# Patient Record
Sex: Female | Born: 1995 | Marital: Single | Smoking: Former smoker
Health system: Southern US, Community
[De-identification: ages and names within clinical notes are randomized; demographics above are authoritative.]

## PROBLEM LIST (undated history)

## (undated) DIAGNOSIS — J45909 Unspecified asthma, uncomplicated: Secondary | ICD-10-CM

## (undated) DIAGNOSIS — D259 Leiomyoma of uterus, unspecified: Secondary | ICD-10-CM

## (undated) DIAGNOSIS — T7840XA Allergy, unspecified, initial encounter: Secondary | ICD-10-CM

## (undated) DIAGNOSIS — N760 Acute vaginitis: Secondary | ICD-10-CM

## (undated) DIAGNOSIS — B379 Candidiasis, unspecified: Secondary | ICD-10-CM

## (undated) DIAGNOSIS — B9689 Other specified bacterial agents as the cause of diseases classified elsewhere: Secondary | ICD-10-CM

## (undated) HISTORY — DX: Unspecified asthma, uncomplicated: J45.909

## (undated) HISTORY — DX: Leiomyoma of uterus, unspecified: D25.9

## (undated) HISTORY — PX: NO PAST SURGERIES: SHX2092

## (undated) HISTORY — DX: Allergy, unspecified, initial encounter: T78.40XA

---
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
## 2013-07-25 ENCOUNTER — Emergency Department (INDEPENDENT_AMBULATORY_CARE_PROVIDER_SITE_OTHER)
Admission: EM | Admit: 2013-07-25 | Discharge: 2013-07-25 | Disposition: A | Discharge status: Home / Self Care | Payer: 59 | Source: Home / Self Care | Attending: Family Medicine | Admitting: Family Medicine

## 2013-07-25 ENCOUNTER — Other Ambulatory Visit (HOSPITAL_COMMUNITY)
Admission: RE | Admit: 2013-07-25 | Discharge: 2013-07-25 | Disposition: A | Discharge status: Home / Self Care | Payer: No Typology Code available for payment source | Source: Ambulatory Visit | Attending: Family Medicine | Admitting: Family Medicine

## 2013-07-25 ENCOUNTER — Encounter (HOSPITAL_COMMUNITY): Payer: Self-pay | Admitting: Emergency Medicine

## 2013-07-25 DIAGNOSIS — N760 Acute vaginitis: Secondary | ICD-10-CM | POA: Diagnosis present

## 2013-07-25 DIAGNOSIS — Z113 Encounter for screening for infections with a predominantly sexual mode of transmission: Secondary | ICD-10-CM | POA: Insufficient documentation

## 2013-07-25 DIAGNOSIS — B373 Candidiasis of vulva and vagina: Secondary | ICD-10-CM

## 2013-07-25 DIAGNOSIS — B3731 Acute candidiasis of vulva and vagina: Secondary | ICD-10-CM

## 2013-07-25 LAB — POCT URINALYSIS DIP (DEVICE)
BILIRUBIN URINE: NEGATIVE
GLUCOSE, UA: NEGATIVE mg/dL
HGB URINE DIPSTICK: NEGATIVE
KETONES UR: NEGATIVE mg/dL
NITRITE: NEGATIVE
PH: 6 (ref 5.0–8.0)
Protein, ur: >=300 mg/dL — AB
Specific Gravity, Urine: >=1.03 (ref 1.005–1.030)
Urobilinogen, UA: 0.2 mg/dL (ref 0.0–1.0)

## 2013-07-25 LAB — POCT PREGNANCY, URINE: Preg Test, Ur: NEGATIVE

## 2013-07-25 LAB — HIV ANTIBODY (ROUTINE TESTING W REFLEX): HIV: NONREACTIVE

## 2013-07-25 MED ORDER — TERCONAZOLE 80 MG VA SUPP: 80.0000 mg | Freq: Every day | VAGINAL | Status: DC

## 2013-07-25 MED ORDER — FLUCONAZOLE 150 MG PO TABS: 150.0000 mg | ORAL_TABLET | Freq: Once | ORAL | Status: DC

## 2013-07-25 NOTE — Discharge Instructions (Signed)
Use medicine as prescribed, we will call if tests show a need for other treatment.  

## 2013-07-25 NOTE — ED Provider Notes (Signed)
CSN: [id number]     Arrival date & time 07/25/13  1223 History   First MD Initiated Contact with Patient 07/25/13 1433     Chief Complaint  Patient presents with  . Vaginal Pain   (Consider location/radiation/quality/duration/timing/severity/associated sxs/prior Treatment) Patient is a 18 y.o. female presenting with vaginal discharge. The history is provided by the patient.  Vaginal Discharge Quality:  White Severity:  Mild Onset quality:  Gradual Duration:  1 day Chronicity:  New Context: recent antibiotic use   Relieved by:  None tried Worsened by:  Nothing tried Ineffective treatments:  None tried Associated symptoms: no abdominal pain, no dysuria, no nausea, no rash, no urinary frequency and no vaginal itching   Risk factors: unprotected sex     Past Medical History  Diagnosis Date  . Asthma   . Bacterial vaginosis   . Yeast infection    Past Surgical History  Procedure Laterality Date  . No past surgeries     Family History  Problem Relation Age of Onset  . Arthritis Mother   . Hypertension Father   . Arthritis Maternal Grandmother    History  Substance Use Topics  . Smoking status: Current Some Day Smoker -- 0.25 packs/day  . Smokeless tobacco: Not on file  . Alcohol Use: No   OB History   Grav Para Term Preterm Abortions TAB SAB Ect Mult Living   0              Review of Systems  Constitutional: Negative.   Gastrointestinal: Negative.  Negative for nausea and abdominal pain.  Genitourinary: Positive for vaginal discharge. Negative for dysuria, urgency, vaginal bleeding, menstrual problem and pelvic pain.    Allergies  Review of patient's allergies indicates no known allergies.  Home Medications   Prior to Admission medications   Medication Sig Start Date End Date Taking? Authorizing Provider  albuterol (PROVENTIL HFA;VENTOLIN HFA) 108 (90 BASE) MCG/ACT inhaler Inhale 2 puffs into the lungs every 6 (six) hours as needed for wheezing or shortness of  breath (asthma).    Historical Provider, MD  clotrimazole (GYNE-LOTRIMIN) 1 % vaginal cream Place 1 Applicatorful vaginally at bedtime. 06/10/12   Freddi StarrJulie N Ethier, PA-C  fluconazole (DIFLUCAN) 150 MG tablet Take 1 tablet (150 mg total) by mouth once. 06/10/12   Freddi StarrJulie N Ethier, PA-C  nystatin cream (MYCOSTATIN) Apply to affected area 2 times daily 06/10/12   Freddi StarrJulie N Ethier, PA-C  triamcinolone cream (KENALOG) 0.1 % Apply topically 2 (two) times daily. 06/10/12   Freddi StarrJulie N Ethier, PA-C   BP 126/74  Pulse 61  Temp(Src) 98.8 F (37.1 C) (Oral)  Resp 16  SpO2 100% Physical Exam  Nursing note and vitals reviewed. Constitutional: She appears well-developed and well-nourished.  Abdominal: Soft. Bowel sounds are normal.  Genitourinary: Uterus normal. Cervix exhibits discharge. Cervix exhibits no motion tenderness. Right adnexum displays no tenderness. Left adnexum displays no tenderness. There is erythema around the vagina. No tenderness or bleeding around the vagina. No foreign body around the vagina. Vaginal discharge found.    ED Course  Procedures (including critical care time) Labs Review Labs Reviewed  POCT URINALYSIS DIP (DEVICE) - Abnormal; Notable for the following:    Protein, ur >=300 (*)    Leukocytes, UA TRACE (*)    All other components within normal limits  HIV ANTIBODY (ROUTINE TESTING)  POCT PREGNANCY, URINE  CERVICOVAGINAL ANCILLARY ONLY    Imaging Review No results found.   MDM   1. Candida vaginitis  Linna HoffJames D Kindl, MD 07/25/13 1520

## 2013-07-25 NOTE — ED Notes (Signed)
C/o vaginal swelling that started yesterday

## 2013-07-26 LAB — CERVICOVAGINAL ANCILLARY ONLY
CHLAMYDIA, DNA PROBE: NEGATIVE
NEISSERIA GONORRHEA: NEGATIVE
WET PREP (BD AFFIRM): NEGATIVE
Wet Prep (BD Affirm): POSITIVE — AB
Wet Prep (BD Affirm): POSITIVE — AB

## 2013-07-26 NOTE — ED Notes (Signed)
GC/Chlamydia neg., Affirm: Candida and Gardnerella pos., Trich neg.  Pt. adequately treated with Diflucan and Terazol supp.  Pt. had Flagyl called in today. 07/26/2013 Vassie MoselleSuzanne M Keamber Macfadden

## 2013-07-26 NOTE — ED Notes (Signed)
Patient called , c/o she is continuing to have vaginal discomfort. After verifying ID, discussed lab findings, and after discussion w Dr Artis FlockKindl , have called Rx for Flagyl 500 mg , 1 PO , BID x 7 days, NR, [facility]. Spoke directly w pharmacist

## 2013-11-08 ENCOUNTER — Ambulatory Visit (INDEPENDENT_AMBULATORY_CARE_PROVIDER_SITE_OTHER): Payer: No Typology Code available for payment source | Admitting: Family Medicine

## 2013-11-08 VITALS — BP 119/82 | HR 74 | Temp 98.4°F | Resp 16 | Ht 67.0 in | Wt 197.0 lb

## 2013-11-08 DIAGNOSIS — L819 Disorder of pigmentation, unspecified: Secondary | ICD-10-CM

## 2013-11-08 DIAGNOSIS — L309 Dermatitis, unspecified: Secondary | ICD-10-CM

## 2013-11-08 DIAGNOSIS — K130 Diseases of lips: Secondary | ICD-10-CM

## 2013-11-08 DIAGNOSIS — L259 Unspecified contact dermatitis, unspecified cause: Secondary | ICD-10-CM

## 2013-11-08 MED ORDER — TRIAMCINOLONE ACETONIDE 0.1 % EX CREA: 1.0000 "application " | TOPICAL_CREAM | Freq: Two times a day (BID) | CUTANEOUS | Status: DC

## 2013-11-08 NOTE — Patient Instructions (Signed)
Use the triamcinolone cream twice daily on arms and neck, once daily on face for 3 days.  Afterwards can use some over-the-counter 1% hydrocortisone cream on the face and lips for about 5 more days.  If not much better in the next 2-3 weeks please return  If it does improve considerably, return only if needed. The medicines can be used intermittently, but I would not use them long-term continuously

## 2013-11-08 NOTE — Progress Notes (Signed)
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, rising junior. She has a history of having a lot of eczema when she was younger, probably last was treated for it she is in high school. She has developed a rash on her face neck and arms. It deviates, though it's not itching as much now. She had crusting and flaking scaling of her lips associated with this, as well as hyperpigmentation developing in splotches. None elsewhere her body.  Objective: No acute distress. Has a little crusty dryness of her lips, especially the left upper and across the lower. The inner aspect of the lips has some hyperpigmented spots. The rest of the oral cavity does not seem to have those. She has a fine eczematoid bumpy rash on her circumoral area and chin. The neck has anteriorly a splotchy hyperpigmentation without much flaking of the skin. The antecubital fossa on the insides have some of the similar rash. The back of the neck looks fairly well spared though there are some little areas of pigment.  Assessment: Eczema Dry lips Hyperpigmentation  Plan: Will treat with triamcinolone cream and hydrocortisone cream. If problems continue to persist she is to return. Can use the topical medications on an intermittent basis if needed

## 2016-01-19 ENCOUNTER — Emergency Department (HOSPITAL_COMMUNITY)
Admission: EM | Admit: 2016-01-19 | Discharge: 2016-01-19 | Disposition: A | Discharge status: Home / Self Care | Payer: PRIVATE HEALTH INSURANCE | Attending: Emergency Medicine | Admitting: Emergency Medicine

## 2016-01-19 ENCOUNTER — Emergency Department (HOSPITAL_COMMUNITY): Payer: PRIVATE HEALTH INSURANCE

## 2016-01-19 ENCOUNTER — Encounter (HOSPITAL_COMMUNITY): Payer: Self-pay | Admitting: *Deleted

## 2016-01-19 DIAGNOSIS — J45909 Unspecified asthma, uncomplicated: Secondary | ICD-10-CM | POA: Diagnosis not present

## 2016-01-19 DIAGNOSIS — Z87891 Personal history of nicotine dependence: Secondary | ICD-10-CM | POA: Diagnosis not present

## 2016-01-19 DIAGNOSIS — G518 Other disorders of facial nerve: Secondary | ICD-10-CM

## 2016-01-19 DIAGNOSIS — G500 Trigeminal neuralgia: Secondary | ICD-10-CM | POA: Insufficient documentation

## 2016-01-19 DIAGNOSIS — R51 Headache: Secondary | ICD-10-CM | POA: Diagnosis present

## 2016-01-19 MED ORDER — IBUPROFEN 600 MG PO TABS: 600.0000 mg | ORAL_TABLET | Freq: Four times a day (QID) | ORAL | 0 refills | Status: AC | PRN

## 2016-01-19 NOTE — ED Notes (Signed)
Pt c/o head pressure and just feeling weird in the head for one week.  Alert oriented skin warm and dry  No distress

## 2016-01-19 NOTE — ED Notes (Signed)
Declined W/C at D/C and was escorted to lobby by RN. 

## 2016-01-19 NOTE — ED Provider Notes (Signed)
[facility]-EMER[facility]ENCY DEPT Provider Note   CSN: [id number] Arrival date & time: 01/19/16  0151     History   Chief Complaint Chief Complaint  Patient presents with  . Head Pressure    HPI Catherine Willis is a 20 y.o. female.  HPI  Patient with no PMH of significance comes ot the ER for evaluation of 2 week, worse in the past 3 days symptoms of "not feeling right", head pressure and sensations of numbness to the left anterior scalp and posterior scalp. She says that she was going to go to an UC in the morning but the sensations were so bothersome that she felt like she may not live through the night. She has not had severe pain but more discomfort. She has not had any fevers, neck pain, weakness, change in vision, difficulty walking, change in coordination, weight loss, CP, back pain, SOB, or any other associated symptoms. She denies having a hx of migraines or history of similar symptoms.  Past Medical History:  Diagnosis Date  . Allergy   . Asthma   . Bacterial vaginosis   . Yeast infection     There are no active problems to display for this patient.   Past Surgical History:  Procedure Laterality Date  . NO PAST SURGERIES      OB History    Gravida Para Term Preterm AB Living   0             SAB TAB Ectopic Multiple Live Births                   Home Medications    Prior to Admission medications   Medication Sig Start Date End Date Taking? Authorizing Provider  albuterol (PROVENTIL HFA;VENTOLIN HFA) 108 (90 BASE) MCG/ACT inhaler Inhale 2 puffs into the lungs every 6 (six) hours as needed for wheezing or shortness of breath (asthma).   Yes Historical Provider, MD    Family History Family History  Problem Relation Age of Onset  . Arthritis Mother   . Hypertension Father   . Arthritis Maternal Grandmother   . Cancer Maternal Grandmother   . Cancer Paternal Grandfather     Social History Social History  Substance Use Topics  . Smoking status: Former Smoker      Packs/day: 0.25  . Smokeless tobacco: Never Used  . Alcohol use Yes     Allergies   Review of patient's allergies indicates no known allergies.   Review of Systems Review of Systems Review of Systems All other systems negative except as documented in the HPI. All pertinent positives and negatives as reviewed in the HPI.   Physical Exam Updated Vital Signs BP 124/79 (BP Location: Right Arm)   Pulse 79   Temp 98.3 F (36.8 C) (Oral)   Resp 18   Ht 5\' 8"  (1.727 m)   Wt 88.5 kg   LMP 01/12/2016   SpO2 100%   BMI 29.65 kg/m   Physical Exam  Constitutional: She appears well-developed and well-nourished.  HENT:  Head: Normocephalic and atraumatic.  Eyes: Conjunctivae are normal. Pupils are equal, round, and reactive to light.  Neck: Trachea normal, normal range of motion and full passive range of motion without pain. Neck supple.  Cardiovascular: Normal rate, regular rhythm and normal pulses.   Pulmonary/Chest: Effort normal and breath sounds normal. Chest wall is not dull to percussion. She exhibits no tenderness, no crepitus, no edema, no deformity and no retraction.  Abdominal: Soft. Normal appearance and bowel  sounds are normal.  Musculoskeletal: Normal range of motion.  Neurological: She is alert. She has normal strength.  Cranial nerves grossly intact on exam. Pt alert and oriented x 3 Upper and lower extremity strength is symmetrical and physiologic Normal muscular tone No facial droop Coordination intact, no limb ataxia,No pronator drift   Skin: Skin is warm, dry and intact.  Psychiatric: She has a normal mood and affect. Her speech is normal and behavior is normal. Judgment and thought content normal. Cognition and memory are normal.     ED Treatments / Results  Labs (all labs ordered are listed, but only abnormal results are displayed) Labs Reviewed - No data to display  EKG  EKG Interpretation None       Radiology No results  found.  Procedures Procedures (including critical care time)  Medications Ordered in ED Medications - No data to display   Initial Impression / Assessment and Plan / ED Course  I have reviewed the triage vital signs and the nursing notes.  Pertinent labs & imaging results that were available during my care of the patient were reviewed by me and considered in my medical decision making (see chart for details).  Clinical Course    Pt would feel more comfortable with going home if she received a head CT. We discussed benefits/risk of scanning her and she prefers to have a scan.  At end of shift, patient sign out to Mohawk IndustriesJeff Hedges, PA-C, pt waiting for CT head if negative she can go home with neuro or PCP follow-up. Pt declined wanting any medication for here or home.  Final Clinical Impressions(s) / ED Diagnoses   Final diagnoses:  None    New Prescriptions New Prescriptions   No medications on file     Marlon Peliffany Robynne Roat, PA-C 01/19/16 0543    Shon Batonourtney F Horton, MD 01/19/16 2303

## 2016-01-19 NOTE — ED Triage Notes (Signed)
Patient presents with c/o head presure for the past 3 weeks or more

## 2016-01-21 ENCOUNTER — Encounter: Payer: Self-pay | Admitting: Diagnostic Neuroimaging

## 2016-01-21 ENCOUNTER — Ambulatory Visit (INDEPENDENT_AMBULATORY_CARE_PROVIDER_SITE_OTHER): Payer: No Typology Code available for payment source | Admitting: Diagnostic Neuroimaging

## 2016-01-21 VITALS — BP 115/79 | HR 68 | Ht 68.0 in | Wt 207.2 lb

## 2016-01-21 DIAGNOSIS — G43009 Migraine without aura, not intractable, without status migrainosus: Secondary | ICD-10-CM

## 2016-01-21 DIAGNOSIS — R51 Headache: Secondary | ICD-10-CM

## 2016-01-21 DIAGNOSIS — R519 Headache, unspecified: Secondary | ICD-10-CM

## 2016-01-21 NOTE — Patient Instructions (Addendum)
Thank you for coming to see [place] at [facility]. I hope we have been able to provide you high quality care today.  You may receive a patient satisfaction survey over the next few weeks. We would appreciate your feedback and comments so that we may continue to improve ourselves and the health of our patients.   - I will check MRI brain to eval for secondary causes of headache  - consider amitriptyline for migraine prevention  - consider rizatriptan for migraine rescue  - consider psychiatry/psychology eval for anxiety, depression, excessive alcohol use  - gradually cut down alcohol; caution with withdrawal symptoms; may need medical/psychiatry supervision    ~~~~~~~~~~~~~~~~~~~~~~~~~~~~~~~~~~~~~~~~~~~~~~~~~~~~~~~~~~~~~~~~~  DR. Trinity Haun'S GUIDE TO HAPPY AND HEALTHY LIVING These are some of my general health and wellness recommendations. Some of them may apply to you better than others. Please use common sense as you try these suggestions and feel free to ask me any questions.   ACTIVITY/FITNESS Mental, social, emotional and physical stimulation are very important for brain and body health. Try learning a new activity (arts, music, language, sports, games).  Keep moving your body to the best of your abilities. You can do this at home, inside or outside, the park, community center, gym or anywhere you like. Consider a physical therapist or personal trainer to get started. Consider the app Sworkit. Fitness trackers such as smart-watches, smart-phones or Fitbits can help as well.   NUTRITION Eat more plants: colorful vegetables, nuts, seeds and berries.  Eat less sugar, salt, preservatives and processed foods.  Avoid toxins such as cigarettes and alcohol.  Drink water when you are thirsty. Warm water with a slice of lemon is an excellent morning drink to start the day.  Consider these websites for more information The Nutrition Source  (https://www.henry-hernandez.biz/) Precision Nutrition (WindowBlog.ch)   RELAXATION Consider practicing mindfulness meditation or other relaxation techniques such as deep breathing, prayer, yoga, tai chi, massage. See website mindful.org or the apps Headspace or Calm to help get started.   SLEEP Try to get at least 7-8+ hours sleep per day. Regular exercise and reduced caffeine will help you sleep better. Practice good sleep hygeine techniques. See website sleep.org for more information.   PLANNING Prepare estate planning, living will, healthcare POA documents. Sometimes this is best planned with the help of an attorney. Theconversationproject.org and agingwithdignity.org are excellent resources.

## 2016-01-21 NOTE — Progress Notes (Signed)
[facility]  PATIENT: Catherine Willis DOB: 02/17/1996  REFERRING CLINICIAN: ER  HISTORY FROM: patient  REASON FOR VISIT: new consult    HISTORICAL  CHIEF COMPLAINT:  Chief Complaint  Patient presents with  . Facial neuralgia    rm 6, New Pt, "heaviness, pressure, numbness in my head; majority of pain in the back of my head, does jump around some; began 1-2 weks ago"    HISTORY OF PRESENT ILLNESS:   20 year old right-handed female here for evaluation of headaches.   For past 2 weeks patient is noted intermittent heaviness, numbness, migratory symptoms in her scalp and back of head. She feels this is doing the back of her head. Also has global headache with pressure sensation. Sometimes symptoms seem to be triggered by drinking a sip of wine. Patient has been drinking excessively for the past 2-3 years, sometimes drinking 1 bottle of wine per day, up to 3 times per week. She averages 14 drinks per week or more.  Patient sometimes has nausea with headaches. No photophobia or phonophobia. Headaches sometimes last all day. She has had to miss class due to these headaches. Patient has been trying ibuprofen without relief.   REVIEW OF SYSTEMS: Full 14 system review of systems performed and negative with exception of: Headache numbness weakness depression anxiety to much sleep decreased energy disinterest in activities.  ALLERGIES: No Known Allergies  HOME MEDICATIONS: Outpatient Medications Prior to Visit  Medication Sig Dispense Refill  . albuterol (PROVENTIL HFA;VENTOLIN HFA) 108 (90 BASE) MCG/ACT inhaler Inhale 2 puffs into the lungs every 6 (six) hours as needed for wheezing or shortness of breath (asthma).    . ibuprofen (ADVIL,MOTRIN) 600 MG tablet Take 1 tablet (600 mg total) by mouth every 6 (six) hours as needed. 30 tablet 0   No facility-administered medications prior to visit.     PAST MEDICAL HISTORY: Past Medical History:  Diagnosis Date  .  Allergy   . Asthma   . Bacterial vaginosis   . Yeast infection     PAST SURGICAL HISTORY: Past Surgical History:  Procedure Laterality Date  . NO PAST SURGERIES      FAMILY HISTORY: Family History  Problem Relation Age of Onset  . Arthritis Mother   . Hypertension Father   . Gout Father   . Arthritis Maternal Grandmother   . Cancer Maternal Grandmother     leukemia  . Cancer Paternal Grandfather     esophageal    SOCIAL HISTORY:  Social History   Social History  . Marital status: Single    Spouse name: N/A  . Number of children: 0  . Years of education: 4616   Occupational History  .      student, visual arts   Social History Main Topics  . Smoking status: Former Games developermoker  . Smokeless tobacco: Never Used     Comment: smoked very little  . Alcohol use Yes     Comment: 01/21/16 at leat 14 drinks/week : wine/liquor  . Drug use: No     Comment: 01/21/16 marijuana in past  . Sexual activity: Yes    Birth control/ protection: Condom   Other Topics Concern  . Not on file   Social History Narrative   Lives with 2 friends   Caffeine - coffee- 3 cups a week     PHYSICAL EXAM  GENERAL EXAM/CONSTITUTIONAL: Vitals:  Vitals:   01/21/16 0820  BP: 115/79  Pulse: 68  Weight: 207 lb 3.2 oz (94 kg)  Height: 5\' 8"  (1.727 m)     Body mass index is 31.5 kg/m.  No exam data present  Patient is in no distress; well developed, nourished and groomed; neck is supple  CARDIOVASCULAR:  Examination of carotid arteries is normal; no carotid bruits  Regular rate and rhythm, no murmurs  Examination of peripheral vascular system by observation and palpation is normal  EYES:  Ophthalmoscopic exam of optic discs and posterior segments is normal; no papilledema or hemorrhages  MUSCULOSKELETAL:  Gait, strength, tone, movements noted in Neurologic exam below  NEUROLOGIC: MENTAL STATUS:  No flowsheet data found.  awake, alert, oriented to person, place and  time  recent and remote memory intact  normal attention and concentration  language fluent, comprehension intact, naming intact,   fund of knowledge appropriate  FLAT AFFECT  CRANIAL NERVE:   2nd - no papilledema on fundoscopic exam  2nd, 3rd, 4th, 6th - pupils equal and reactive to light, visual fields full to confrontation, extraocular muscles intact, no nystagmus  5th - facial sensation symmetric  7th - facial strength symmetric  8th - hearing intact  9th - palate elevates symmetrically, uvula midline  11th - shoulder shrug symmetric  12th - tongue protrusion midline  MOTOR:   normal bulk and tone, full strength in the BUE, BLE  SENSORY:   normal and symmetric to light touch, temperature, vibration  COORDINATION:   finger-nose-finger, fine finger movements normal  REFLEXES:   deep tendon reflexes present and symmetric  GAIT/STATION:   narrow based gait; able to walk tandem; romberg is negative    DIAGNOSTIC DATA (LABS, IMAGING, TESTING) - I reviewed patient records, labs, notes, testing and imaging myself where available.  No results found for: WBC, HGB, HCT, MCV, PLT No results found for: NA, K, CL, CO2, GLUCOSE, BUN, CREATININE, CALCIUM, PROT, ALBUMIN, AST, ALT, ALKPHOS, BILITOT, GFRNONAA, GFRAA No results found for: CHOL, HDL, LDLCALC, LDLDIRECT, TRIG, CHOLHDL No results found for: ZOXW9UHGBA1C No results found for: VITAMINB12 No results found for: TSH   01/19/16 CT head [I reviewed images myself and agree with interpretation. -VRP]  - normal    ASSESSMENT AND PLAN  20 y.o. year old female here with new onset headaches in the past 2 weeks with tension and migraine features. 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 Neurologic examination is otherwise unremarkable. Will check MRI of the brain to rule out secondary causes.   Ddx: migraine vs secondary HA (CNS structural, vascular, autoimmune, inflamm; ETOH abuse; metabolic)  1.  Migraine without aura and without status migrainosus, not intractable   2. New onset headache   3. Scalp pain      PLAN: - MRI brain and labs to eval for secondary causes of headache - consider amitriptyline for migraine prevention - consider rizatriptan for migraine rescue - consider psychiatry/psychology eval for anxiety, depression, excessive ETOH use - gradually cut down ETOH; cautioned regarding withdrawal symptoms; may need medical/psychiatry supervision  Orders Placed This Encounter  Procedures  . MR BRAIN W WO CONTRAST  . CBC with Differential/Platelet  . CMP  . Vitamin B12  . TSH  . Hemoglobin A1c   Return in about 1 month (around 02/20/2016).  I reviewed images, labs, notes, records myself. I summarized findings and reviewed with patient, for this high risk condition (NEW ONSET SEVERE HEADACHES) requiring high complexity decision making.     Suanne MarkerVIKRAM R. PENUMALLI, MD 01/21/2016, 9:20 AM Certified in Neurology, Neurophysiology and Neuroimaging  [facility] [phone]  [address] [place], [place] [address] [phone]

## 2016-01-22 ENCOUNTER — Other Ambulatory Visit: Payer: Self-pay | Admitting: *Deleted

## 2016-01-22 ENCOUNTER — Encounter: Payer: Self-pay | Admitting: Diagnostic Neuroimaging

## 2016-01-22 ENCOUNTER — Telehealth: Payer: Self-pay | Admitting: *Deleted

## 2016-01-22 ENCOUNTER — Other Ambulatory Visit (INDEPENDENT_AMBULATORY_CARE_PROVIDER_SITE_OTHER): Payer: Self-pay

## 2016-01-22 DIAGNOSIS — R519 Headache, unspecified: Secondary | ICD-10-CM

## 2016-01-22 DIAGNOSIS — R899 Unspecified abnormal finding in specimens from other organs, systems and tissues: Secondary | ICD-10-CM

## 2016-01-22 DIAGNOSIS — G43009 Migraine without aura, not intractable, without status migrainosus: Secondary | ICD-10-CM

## 2016-01-22 DIAGNOSIS — R51 Headache: Secondary | ICD-10-CM

## 2016-01-22 DIAGNOSIS — Z0289 Encounter for other administrative examinations: Secondary | ICD-10-CM

## 2016-01-22 LAB — COMPREHENSIVE METABOLIC PANEL
ALT: 12 IU/L (ref 0–32)
AST: 24 IU/L (ref 0–40)
Albumin/Globulin Ratio: 1.4 (ref 1.2–2.2)
Albumin: 4.5 g/dL (ref 3.5–5.5)
Alkaline Phosphatase: 73 IU/L (ref 39–117)
BUN/Creatinine Ratio: 10 (ref 9–23)
BUN: 8 mg/dL (ref 6–20)
Bilirubin Total: 0.3 mg/dL (ref 0.0–1.2)
CALCIUM: 9.8 mg/dL (ref 8.7–10.2)
CO2: 25 mmol/L (ref 18–29)
Chloride: 101 mmol/L (ref 96–106)
Creatinine, Ser: 0.84 mg/dL (ref 0.57–1.00)
GFR calc Af Amer: 114 mL/min/{1.73_m2} (ref >59)
GFR, EST NON AFRICAN AMERICAN: 99 mL/min/{1.73_m2} (ref >59)
Globulin, Total: 3.2 g/dL (ref 1.5–4.5)
Glucose: 79 mg/dL (ref 65–99)
POTASSIUM: 5.1 mmol/L (ref 3.5–5.2)
SODIUM: 141 mmol/L (ref 134–144)
Total Protein: 7.7 g/dL (ref 6.0–8.5)

## 2016-01-22 LAB — CBC WITH DIFFERENTIAL/PLATELET
BASOS: 1 %
Basophils Absolute: 0 10*3/uL (ref 0.0–0.2)
EOS (ABSOLUTE): 0.1 10*3/uL (ref 0.0–0.4)
Eos: 3 %
HEMATOCRIT: 41.7 % (ref 34.0–46.6)
Hemoglobin: 13.4 g/dL (ref 11.1–15.9)
IMMATURE GRANS (ABS): 0 10*3/uL (ref 0.0–0.1)
Immature Granulocytes: 0 %
LYMPHS: 42 %
Lymphocytes Absolute: 0.7 10*3/uL (ref 0.7–3.1)
MCH: 29.2 pg (ref 26.6–33.0)
MCHC: 32.1 g/dL (ref 31.5–35.7)
MCV: 91 fL (ref 79–97)
Monocytes Absolute: 0.5 10*3/uL (ref 0.1–0.9)
Monocytes: 27 %
NEUTROS ABS: 0.5 10*3/uL — AB (ref 1.4–7.0)
Neutrophils: 27 %
Platelets: 324 10*3/uL (ref 150–379)
RBC: 4.59 x10E6/uL (ref 3.77–5.28)
RDW: 13.4 % (ref 12.3–15.4)
WBC: 1.8 10*3/uL — CL (ref 3.4–10.8)

## 2016-01-22 LAB — HEMOGLOBIN A1C
Est. average glucose Bld gHb Est-mCnc: 103 mg/dL
Hgb A1c MFr Bld: 5.2 % (ref 4.8–5.6)

## 2016-01-22 LAB — VITAMIN B12: Vitamin B-12: 228 pg/mL (ref 211–946)

## 2016-01-22 LAB — TSH: TSH: 0.99 u[IU]/mL (ref 0.450–4.500)

## 2016-01-22 NOTE — Telephone Encounter (Signed)
Per Dr Marjory LiesPenumalli, spoke with patient and informed her that her lab results were unremarkable except for  low WBC, but the sample may not be accurate per the lab. Advised Dr Marjory LiesPenumalli recommends a repeat CBC with diff. Otherwise he will continue with her current plan. She stated she wanted to come in today after her class to do repeat lab. Advised she arrive by 4:30 pm. She verbalized understanding, appreciation.

## 2016-01-23 LAB — CBC WITH DIFFERENTIAL/PLATELET
Basophils Absolute: 0 10*3/uL (ref 0.0–0.2)
Basos: 1 %
EOS (ABSOLUTE): 0.1 10*3/uL (ref 0.0–0.4)
Eos: 3 %
Hematocrit: 41.4 % (ref 34.0–46.6)
Hemoglobin: 13.8 g/dL (ref 11.1–15.9)
Immature Grans (Abs): 0 10*3/uL (ref 0.0–0.1)
Immature Granulocytes: 0 %
Lymphocytes Absolute: 1.1 10*3/uL (ref 0.7–3.1)
Lymphs: 46 %
MCH: 30 pg (ref 26.6–33.0)
MCHC: 33.3 g/dL (ref 31.5–35.7)
MCV: 90 fL (ref 79–97)
MONOCYTES: 24 %
Monocytes Absolute: 0.6 10*3/uL (ref 0.1–0.9)
Neutrophils Absolute: 0.6 10*3/uL — ABNORMAL LOW (ref 1.4–7.0)
Neutrophils: 26 %
Platelets: 372 10*3/uL (ref 150–379)
RBC: 4.6 x10E6/uL (ref 3.77–5.28)
RDW: 13.4 % (ref 12.3–15.4)
WBC: 2.3 10*3/uL — AB (ref 3.4–10.8)

## 2016-01-27 ENCOUNTER — Telehealth: Payer: Self-pay | Admitting: *Deleted

## 2016-01-27 NOTE — Telephone Encounter (Signed)
Spoke with patient and advised her to establish with PCP as soon as possible. [facility] and instructed her to tell office Dr Marjory LiesPenumalli wants her to be seen asap re: labs. Advised her the medical group can see her recent labs in her medical record.  She verbalized understanding, stated she would call today.

## 2016-01-27 NOTE — Telephone Encounter (Signed)
She should establish with PCP asap. -VRP

## 2016-01-27 NOTE — Telephone Encounter (Signed)
Spoke with patient and informed her, per Dr Marjory LiesPenumalli, her repeat lab still showed very low WBC. She does nnot have a PCP locally. Informed her this RN would discuss with Dr Marjory LiesPenumalli re: referral to hematology clinic and call her back. She verbalized understanding appreciation.

## 2016-01-29 ENCOUNTER — Ambulatory Visit (INDEPENDENT_AMBULATORY_CARE_PROVIDER_SITE_OTHER): Payer: No Typology Code available for payment source | Admitting: Family Medicine

## 2016-01-29 VITALS — BP 122/72 | HR 74 | Temp 98.1°F | Resp 17 | Ht 67.5 in | Wt 205.0 lb

## 2016-01-29 DIAGNOSIS — Z113 Encounter for screening for infections with a predominantly sexual mode of transmission: Secondary | ICD-10-CM

## 2016-01-29 DIAGNOSIS — B9689 Other specified bacterial agents as the cause of diseases classified elsewhere: Secondary | ICD-10-CM | POA: Diagnosis not present

## 2016-01-29 DIAGNOSIS — N760 Acute vaginitis: Secondary | ICD-10-CM | POA: Diagnosis not present

## 2016-01-29 DIAGNOSIS — D709 Neutropenia, unspecified: Secondary | ICD-10-CM

## 2016-01-29 LAB — POCT CBC
Granulocyte percent: 49.1 %G (ref 37–80)
HEMATOCRIT: 39.4 % (ref 37.7–47.9)
Hemoglobin: 13.9 g/dL (ref 12.2–16.2)
Lymph, poc: 1.3 (ref 0.6–3.4)
MCH, POC: 30.4 pg (ref 27–31.2)
MCHC: 35.4 g/dL (ref 31.8–35.4)
MCV: 86.1 fL (ref 80–97)
MID (CBC): 0.3 (ref 0–0.9)
MPV: 8.1 fL (ref 0–99.8)
PLATELET COUNT, POC: 331 10*3/uL (ref 142–424)
POC GRANULOCYTE: 1.6 — AB (ref 2–6.9)
POC LYMPH %: 41.2 % (ref 10–50)
POC MID %: 9.7 %M (ref 0–12)
RBC: 4.58 M/uL (ref 4.04–5.48)
RDW, POC: 13 %
WBC: 3.2 10*3/uL — AB (ref 4.6–10.2)

## 2016-01-29 LAB — POCT WET + KOH PREP
TRICH BY WET PREP: ABSENT
YEAST BY WET PREP: ABSENT
Yeast by KOH: ABSENT

## 2016-01-29 MED ORDER — METRONIDAZOLE 0.75 % VA GEL: 1.0000 | Freq: Two times a day (BID) | VAGINAL | 0 refills | Status: DC

## 2016-01-29 NOTE — Progress Notes (Signed)
Patient ID: Catherine DeemMykaela Willis, female    DOB: 12/20/1995, 20 y.o.   MRN: [id number]  PCP: No PCP Per Patient  Chief Complaint  Patient presents with  . Follow-up    low white blood count per patient     Subjective:   HPI 20 year old female presents for evaluation of leukocytopenia. [facility] She has been seeing neurology for migraine evaluation and lab work indicated a very low white count with neutropenia. She doesn't have a PCP and was advised to follow-up with a primary care provider for further evaluation.  She denies known recent illness. Although reports insidious nausea without vomiting for over 1 month. LMPD October 22nd normal cycle. Recent unprotected sex without known exposure to STD. Abdominal pain after eat and over past week abdominal discomfort with bloating. Regular stool and bowel movements. No sore throat. Requests STD testing.  Social History   Social History  . Marital status: Single    Spouse name: N/A  . Number of children: 0  . Years of education: 3416   Occupational History  .      student, visual arts   Social History Main Topics  . Smoking status: Former Games developermoker  . Smokeless tobacco: Never Used     Comment: smoked very little  . Alcohol use Yes     Comment: 01/21/16 at leat 14 drinks/week : wine/liquor  . Drug use: No     Comment: 01/21/16 marijuana in past  . Sexual activity: Yes    Birth control/ protection: Condom   Other Topics Concern  . Not on file   Social History Narrative   Lives with 2 friends   Caffeine - coffee- 3 cups a week   Family History  Problem Relation Age of Onset  . Arthritis Mother   . Hypertension Father   . Gout Father   . Arthritis Maternal Grandmother   . Cancer Maternal Grandmother     leukemia  . Cancer Paternal Grandfather     esophageal   Review of Systems See HPI  There are no active problems to display for this patient.  Prior to Admission medications   Medication Sig Start Date End  Date Taking? Authorizing Provider  albuterol (PROVENTIL HFA;VENTOLIN HFA) 108 (90 BASE) MCG/ACT inhaler Inhale 2 puffs into the lungs every 6 (six) hours as needed for wheezing or shortness of breath (asthma).   Yes Historical Provider, MD  ibuprofen (ADVIL,MOTRIN) 600 MG tablet Take 1 tablet (600 mg total) by mouth every 6 (six) hours as needed. Patient not taking: Reported on 01/29/2016 01/19/16   Marlon Peliffany Greene, PA-C  No Known Allergies    Objective:  Physical Exam  Constitutional: She is oriented to person, place, and time. She appears well-developed and well-nourished.  HENT:  Head: Normocephalic and atraumatic.  Right Ear: External ear normal.  Left Ear: External ear normal.  Nose: Nose normal.  Mouth/Throat: Oropharynx is clear and moist.  Eyes: Conjunctivae and EOM are normal. Pupils are equal, round, and reactive to light.  Neck: Normal range of motion. Neck supple.  Cardiovascular: Normal rate, regular rhythm, normal heart sounds and intact distal pulses.   Pulmonary/Chest: Effort normal and breath sounds normal.  Abdominal: Soft. Bowel sounds are normal. She exhibits no distension and no mass. There is no tenderness. There is no rebound and no guarding.  Negative abdominal tenderness Unable to palpate spleen and liver.   Genitourinary: Vaginal discharge found.  Genitourinary Comments: Normal female external genitalia without lesion.  No inguinal lymphadenopathy. Vaginal mucosa is pink and moist without lesions. Cervix is closed with thickened white discharge, not friable.  No cervical motion tenderness, adnexal fullness or tenderness. KOH/WET PREP specimen obtained.  Musculoskeletal: Normal range of motion.  Lymphadenopathy:    She has no cervical adenopathy.  Neurological: She is alert and oriented to person, place, and time.  Skin: Skin is warm and dry.  Negative for bruising    Psychiatric: She has a normal mood and affect. Her behavior is normal. Judgment and thought  content normal.    Vitals:   01/29/16 1354  BP: 122/72  Pulse: 74  Resp: 17  Temp: 98.1 F (36.7 C)   Assessment & Plan:  1. Neutropenia, unspecified type (HCC) - POCT CBC - CBC with Differential/Platelet; Future  2. Screening examination for STD (sexually transmitted disease) - POCT Wet + KOH Prep - GC/Chlamydia Probe Amp - HIV antibody (with reflex) - RPR  3. Bacterial vaginosis -start Metronidazole (MetroGel) 0.75% 1 application twice daily for 5 days.   Will follow-up you regarding lab results.  Godfrey PickKimberly S. Tiburcio PeaHarris, MSN, FNP-C [facility]

## 2016-01-29 NOTE — Patient Instructions (Addendum)
Return for fast track lab work on 01/31/16 for recheck of WBC.  Use Metrogel insert 2 times daily for four days.    IF you received an x-ray today, you will receive an invoice from [facility]. Please contact [facility] at [phone] with questions or concerns regarding your invoice.   IF you received labwork today, you will receive an invoice from [facility]. Please contact [facility] at [phone] with questions or concerns regarding your invoice.   Our billing staff will not be able to assist you with questions regarding bills from these companies.  You will be contacted with the lab results as soon as they are available. The fastest way to get your results is to activate your My Chart account. Instructions are located on the last page of this paperwork. If you have not heard from us regarding the results in 2 weeks, please contact this office.     Bacterial Vaginosis Bacterial vaginosis is an infection of the vagina. It happens when too many germs (bacteria) grow in the vagina. Having this infection puts you at risk for getting other infections from sex. Treating this infection can help lower your risk for other infections, such as:   Chlamydia.  Gonorrhea.  HIV.  Herpes. HOME CARE  Take your medicine as told by your doctor.  Finish your medicine even if you start to feel better.  Tell your sex partner that you have an infection. They should see their doctor for treatment.  During treatment:  Avoid sex or use condoms correctly.  Do not douche.  Do not drink alcohol unless your doctor tells you it is ok.  Do not breastfeed unless your doctor tells you it is ok. GET HELP IF:  You are not getting better after 3 days of treatment.  You have more grey fluid (discharge) coming from your vagina than before.  You have more pain than before.  You have a fever. MAKE SURE YOU:   Understand these instructions.  Will watch  your condition.  Will get help right away if you are not doing well or get worse.   This information is not intended to replace advice given to you by your health care provider. Make sure you discuss any questions you have with your health care provider.   Document Released: 12/17/2007 Document Revised: 03/30/2014 Document Reviewed: 10/19/2012 Elsevier Interactive Patient Education [employer]2016 [employer]vier Inc.

## 2016-01-30 ENCOUNTER — Encounter: Payer: Self-pay | Admitting: Family Medicine

## 2016-01-30 LAB — RPR

## 2016-01-30 LAB — GC/CHLAMYDIA PROBE AMP
CT Probe RNA: NOT DETECTED
GC PROBE AMP APTIMA: NOT DETECTED

## 2016-01-30 LAB — HIV ANTIBODY (ROUTINE TESTING W REFLEX): HIV: NONREACTIVE

## 2016-01-30 NOTE — Progress Notes (Signed)
January 30, 2016   [facility] Willis [address] [place] [address]   Dear Ms. Trnka,  Below are the results from your recent visit were normal.  Resulted Orders  GC/Chlamydia Probe Amp  Result Value Ref Range   CT Probe RNA NOT DETECTED      Comment:                        **Normal Reference Range: NOT DETECTED**   This test was performed using the APTIMA COMBO2 Assay ([employer]   The analytical performance characteristics of this assay, when used to test SurePath specimens have been determined by [facility]      GC Probe RNA NOT DETECTED      Comment:                        **Normal Reference Range: NOT DETECTED**   This test was performed using the APTIMA COMBO2 Assay ([employer]   The analytical performance characteristics of this assay, when used to test SurePath specimens have been determined by [facility]      Narrative   Performed at:  [facility]                [address], [address]                [place], [place] [address]  HIV antibody (with reflex)  Result Value Ref Range   HIV 1&2 Ab, 4th Generation NONREACTIVE NONREACTIVE     Comment:       HIV-1 antigen and HIV-1/HIV-2 antibodies were not detected.  There is no laboratory evidence of HIV infection.   HIV-1/2 Antibody Diff        Not indicated. HIV-1 RNA, Qual TMA          Not indicated.     PLEASE NOTE: This information has been disclosed to you from records whose confidentiality may be protected by state law. If your state requires such protection, then the state law prohibits you from making any further disclosure of the information without the specific written consent of the person to whom it pertains, or as otherwise permitted by law. A general authorization for the release of medical or other information is NOT sufficient for this purpose.   The performance of this assay has not been clinically validated in patients less than 2 years  old.   For additional information please refer to http://education.[facility].com/faq/FAQ106.  (This link is being provided for informational/educational purposes only.)      Narrative   Performed at:  [facility]                [address], [address]                [place], [place] [address]  RPR  Result Value Ref Range   RPR Ser Ql NON REAC NON REAC   Narrative   Performed at:  [facility]                [address], [address]                [place], [place] [address]     If you have any questions or concerns, please don't hesitate to call.  Sincerely,   Catherine CourtsKimberly Zanaria Morell, FNP

## 2016-01-31 ENCOUNTER — Encounter: Payer: Self-pay | Admitting: Emergency Medicine

## 2016-01-31 ENCOUNTER — Other Ambulatory Visit (INDEPENDENT_AMBULATORY_CARE_PROVIDER_SITE_OTHER): Payer: No Typology Code available for payment source | Admitting: Family Medicine

## 2016-01-31 DIAGNOSIS — D709 Neutropenia, unspecified: Secondary | ICD-10-CM

## 2016-01-31 LAB — CBC WITH DIFFERENTIAL/PLATELET
BASOS ABS: 0 {cells}/uL (ref 0–200)
Basophils Relative: 0 %
Eosinophils Absolute: 56 cells/uL (ref 15–500)
Eosinophils Relative: 2 %
HCT: 38.8 % (ref 35.0–45.0)
Hemoglobin: 12.9 g/dL (ref 11.7–15.5)
LYMPHS ABS: 1008 {cells}/uL (ref 850–3900)
Lymphocytes Relative: 36 %
MCH: 29.7 pg (ref 27.0–33.0)
MCHC: 33.2 g/dL (ref 32.0–36.0)
MCV: 89.4 fL (ref 80.0–100.0)
MONOS PCT: 22 %
MPV: 10.6 fL (ref 7.5–12.5)
Monocytes Absolute: 616 cells/uL (ref 200–950)
NEUTROS ABS: 1120 {cells}/uL — AB (ref 1500–7800)
NEUTROS PCT: 40 %
PLATELETS: 360 10*3/uL (ref 140–400)
RBC: 4.34 MIL/uL (ref 3.80–5.10)
RDW: 13.2 % (ref 11.0–15.0)
WBC: 2.8 10*3/uL — ABNORMAL LOW (ref 3.8–10.8)

## 2016-02-02 ENCOUNTER — Ambulatory Visit
Admission: RE | Admit: 2016-02-02 | Discharge: 2016-02-02 | Disposition: A | Discharge status: Home / Self Care | Payer: PRIVATE HEALTH INSURANCE | Source: Ambulatory Visit | Attending: Diagnostic Neuroimaging | Admitting: Diagnostic Neuroimaging

## 2016-02-02 DIAGNOSIS — R519 Headache, unspecified: Secondary | ICD-10-CM

## 2016-02-02 DIAGNOSIS — G43009 Migraine without aura, not intractable, without status migrainosus: Secondary | ICD-10-CM

## 2016-02-02 DIAGNOSIS — R51 Headache: Secondary | ICD-10-CM

## 2016-02-02 MED ORDER — GADOBENATE DIMEGLUMINE 529 MG/ML IV SOLN
18.0000 mL | Freq: Once | INTRAVENOUS | Status: AC | PRN
  Administered 2016-02-02: 18 mL via INTRAVENOUS

## 2016-02-03 ENCOUNTER — Telehealth: Payer: Self-pay | Admitting: Family Medicine

## 2016-02-03 ENCOUNTER — Other Ambulatory Visit: Payer: Self-pay | Admitting: Family Medicine

## 2016-02-03 DIAGNOSIS — D709 Neutropenia, unspecified: Secondary | ICD-10-CM

## 2016-02-03 DIAGNOSIS — D72819 Decreased white blood cell count, unspecified: Secondary | ICD-10-CM

## 2016-02-03 NOTE — Progress Notes (Signed)
Please call patient and advise that I have referred her to hematology for a second opinion of her persistent leukocytopenia. Advise that they will contact her to schedule an appointment. Her last WBC count 2.8 which abnormally low.  Catherine PickKimberly S. Tiburcio PeaHarris, MSN, FNP-C [facility]

## 2016-02-03 NOTE — Telephone Encounter (Signed)
Patient advised and will follow our instructions. She will also call us back if she doesnot get a call from hematology this week.

## 2016-02-03 NOTE — Telephone Encounter (Signed)
Call patient to advise I have referred her to hematology as her last WBC is still abnormally low 2.8. If she begins to feel ill or run a fever, she needs to return to the clinic asap or go to nearest emergency department.  Godfrey PickKimberly S. Tiburcio PeaHarris, MSN, FNP-C [facility]

## 2016-02-05 ENCOUNTER — Telehealth: Payer: Self-pay | Admitting: Hematology

## 2016-02-05 NOTE — Telephone Encounter (Signed)
Pt is a Consulting civil engineerstudent at college here and she confirmed appt, verified address in EPIC (parents address did not give [place] address) her contact number is the cell number, verified insurance.  Pt recorded address and appointment info, decline appointment letter. Inbasket referring provider appt date/time.

## 2016-02-10 ENCOUNTER — Telehealth: Payer: Self-pay | Admitting: *Deleted

## 2016-02-10 NOTE — Telephone Encounter (Signed)
Called home phone; did not LVM.  Called mobile number, LVM requesting call back re: MRI results. Left name, number.

## 2016-02-10 NOTE — Telephone Encounter (Signed)
Received call back for patient. Informed her per Dr Marjory LiesPenumalli, her MRI brain showed no major findings. Reviewed his office note instructions with her, reminded her of follow up on 02/17/16. She verbalized understanding, appreciation.

## 2016-02-17 ENCOUNTER — Encounter: Payer: Self-pay | Admitting: Diagnostic Neuroimaging

## 2016-02-17 ENCOUNTER — Ambulatory Visit (INDEPENDENT_AMBULATORY_CARE_PROVIDER_SITE_OTHER): Payer: No Typology Code available for payment source | Admitting: Diagnostic Neuroimaging

## 2016-02-17 VITALS — BP 120/76 | HR 60 | Wt 215.0 lb

## 2016-02-17 DIAGNOSIS — G43009 Migraine without aura, not intractable, without status migrainosus: Secondary | ICD-10-CM

## 2016-02-17 DIAGNOSIS — D72819 Decreased white blood cell count, unspecified: Secondary | ICD-10-CM

## 2016-02-17 NOTE — Progress Notes (Signed)
[facility]  PATIENT: Catherine Willis DOB: 1995/06/28  REFERRING CLINICIAN: ER  HISTORY FROM: patient  REASON FOR VISIT: follow up    HISTORICAL  CHIEF COMPLAINT:  Chief Complaint  Patient presents with  . Migraine    rm 7, "migrianes, headaches better; have almost daily HA  but much less painful; established PCP- Joaquin Courts, FNP; seeing hematologist on 02/20/16"  . Follow-up    post MRI    HISTORY OF PRESENT ILLNESS:   UPDATE 02/17/16: Since last visit, doing a little better. HA have continued, but are less severe. Has cut down on EtOH. Now has PCP and is planning to see hematology on 02/20/16.   PRIOR HPI (01/21/16): 20 year old right-handed female here for evaluation of headaches. For past 2 weeks patient is noted intermittent heaviness, numbness, migratory symptoms in her scalp and back of head. She feels this is doing the back of her head. Also has global headache with pressure sensation. Sometimes symptoms seem to be triggered by drinking a sip of wine. Patient has been drinking excessively for the past 2-3 years, sometimes drinking 1 bottle of wine per day, up to 3 times per week. She averages 14 drinks per week or more. Patient sometimes has nausea with headaches. No photophobia or phonophobia. Headaches sometimes last all day. She has had to miss class due to these headaches. Patient has been trying ibuprofen without relief.   REVIEW OF SYSTEMS: Full 14 system review of systems performed and negative except as per HPI.   ALLERGIES: No Known Allergies  HOME MEDICATIONS: Outpatient Medications Prior to Visit  Medication Sig Dispense Refill  . albuterol (PROVENTIL HFA;VENTOLIN HFA) 108 (90 BASE) MCG/ACT inhaler Inhale 2 puffs into the lungs every 6 (six) hours as needed for wheezing or shortness of breath (asthma).    . ibuprofen (ADVIL,MOTRIN) 600 MG tablet Take 1 tablet (600 mg total) by mouth every 6 (six) hours as needed. 30 tablet 0  .  metroNIDAZOLE (METROGEL VAGINAL) 0.75 % vaginal gel Place 1 Applicatorful vaginally 2 (two) times daily. 70 g 0   No facility-administered medications prior to visit.     PAST MEDICAL HISTORY: Past Medical History:  Diagnosis Date  . Allergy   . Asthma   . Bacterial vaginosis   . Yeast infection     PAST SURGICAL HISTORY: Past Surgical History:  Procedure Laterality Date  . NO PAST SURGERIES      FAMILY HISTORY: Family History  Problem Relation Age of Onset  . Arthritis Mother   . Hypertension Father   . Gout Father   . Arthritis Maternal Grandmother   . Cancer Maternal Grandmother     leukemia  . Cancer Paternal Grandfather     esophageal    SOCIAL HISTORY:  Social History   Social History  . Marital status: Single    Spouse name: N/A  . Number of children: 0  . Years of education: 53   Occupational History  .      student, visual arts   Social History Main Topics  . Smoking status: Former Games developer  . Smokeless tobacco: Never Used     Comment: smoked very little  . Alcohol use Yes     Comment: 01/21/16 at leat 14 drinks/week : wine/liquor  . Drug use: No     Comment: 01/21/16 marijuana in past  . Sexual activity: Yes    Birth control/ protection: Condom   Other Topics Concern  . Not on file   Social History  Narrative   Lives with 2 friends   Caffeine - coffee- 3 cups a week     PHYSICAL EXAM  GENERAL EXAM/CONSTITUTIONAL: Vitals:  Vitals:   02/17/16 1056 02/17/16 1058  BP:  120/76  Pulse:  60  Weight: 215 lb (97.5 kg)    Body mass index is 33.18 kg/m. No exam data present  Patient is in no distress; well developed, nourished and groomed; neck is supple  CARDIOVASCULAR:  Examination of carotid arteries is normal; no carotid bruits  Regular rate and rhythm, no murmurs  Examination of peripheral vascular system by observation and palpation is normal  EYES:  Ophthalmoscopic exam of optic discs and posterior segments is normal; no  papilledema or hemorrhages  MUSCULOSKELETAL:  Gait, strength, tone, movements noted in Neurologic exam below  NEUROLOGIC: MENTAL STATUS:  No flowsheet data found.  awake, alert, oriented to person, place and time  recent and remote memory intact  normal attention and concentration  language fluent, comprehension intact, naming intact,   fund of knowledge appropriate  FLAT AFFECT  CRANIAL NERVE:   2nd - no papilledema on fundoscopic exam  2nd, 3rd, 4th, 6th - pupils equal and reactive to light, visual fields full to confrontation, extraocular muscles intact, no nystagmus  5th - facial sensation symmetric  7th - facial strength symmetric  8th - hearing intact  9th - palate elevates symmetrically, uvula midline  11th - shoulder shrug symmetric  12th - tongue protrusion midline  MOTOR:   normal bulk and tone, full strength in the BUE, BLE  SENSORY:   normal and symmetric to light touch, temperature, vibration  COORDINATION:   finger-nose-finger, fine finger movements normal  REFLEXES:   deep tendon reflexes present and symmetric  GAIT/STATION:   narrow based gait; able to walk tandem; romberg is negative    DIAGNOSTIC DATA (LABS, IMAGING, TESTING) - I reviewed patient records, labs, notes, testing and imaging myself where available.  Lab Results  Component Value Date   WBC 2.8 (L) 01/31/2016   HGB 12.9 01/31/2016   HCT 38.8 01/31/2016   MCV 89.4 01/31/2016   PLT 360 01/31/2016      Component Value Date/Time   NA 141 01/21/2016 1003   K 5.1 01/21/2016 1003   CL 101 01/21/2016 1003   CO2 25 01/21/2016 1003   GLUCOSE 79 01/21/2016 1003   BUN 8 01/21/2016 1003   CREATININE 0.84 01/21/2016 1003   CALCIUM 9.8 01/21/2016 1003   PROT 7.7 01/21/2016 1003   ALBUMIN 4.5 01/21/2016 1003   AST 24 01/21/2016 1003   ALT 12 01/21/2016 1003   ALKPHOS 73 01/21/2016 1003   BILITOT 0.3 01/21/2016 1003   GFRNONAA 99 01/21/2016 1003   GFRAA 114  01/21/2016 1003   No results found for: CHOL, HDL, LDLCALC, LDLDIRECT, TRIG, CHOLHDL Lab Results  Component Value Date   HGBA1C 5.2 01/21/2016   Lab Results  Component Value Date   VITAMINB12 228 01/21/2016   Lab Results  Component Value Date   TSH 0.990 01/21/2016     01/19/16 CT head [I reviewed images myself and agree with interpretation. -VRP]  - normal  02/02/16 MRI brain [I reviewed images myself and agree with interpretation. -VRP]  1. 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 2. No acute findings.    ASSESSMENT AND PLAN  20 y.o. year old female here with new onset headaches in the past 2 weeks with tension and migraine features. 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  Neurologic examination is otherwise unremarkable. Incidental borderline cerebellar tonsillary ectopia.    Ddx: migraine vs secondary HA (ETOH abuse)  1. Migraine without aura and without status migrainosus, not intractable   2. Leukopenia, unspecified type      PLAN: - consider amitriptyline for migraine prevention - consider rizatriptan for migraine rescue - follow up with PCP and psychiatry/psychology eval for anxiety, depression, excessive ETOH use - follow up with hematology re: low WBC (could be bone marrow suppression due to chronic EtOH vs other secondary cause) - gradually cut down ETOH; cautioned regarding withdrawal symptoms; may need medical/psychiatry supervision  Return in about 3 months (around 05/19/2016).     Suanne MarkerVIKRAM R. PENUMALLI, MD 02/17/2016, 11:24 AM Certified in Neurology, Neurophysiology and Neuroimaging  [facility] [address], [address] [place], [place] [address] [phone]

## 2016-02-20 ENCOUNTER — Ambulatory Visit (HOSPITAL_BASED_OUTPATIENT_CLINIC_OR_DEPARTMENT_OTHER): Payer: PRIVATE HEALTH INSURANCE

## 2016-02-20 ENCOUNTER — Encounter: Payer: Self-pay | Admitting: Hematology

## 2016-02-20 ENCOUNTER — Ambulatory Visit (HOSPITAL_BASED_OUTPATIENT_CLINIC_OR_DEPARTMENT_OTHER): Payer: PRIVATE HEALTH INSURANCE | Admitting: Hematology

## 2016-02-20 ENCOUNTER — Telehealth: Payer: Self-pay | Admitting: Hematology

## 2016-02-20 VITALS — BP 124/81 | HR 69 | Temp 98.3°F | Resp 18 | Ht 67.5 in | Wt 212.5 lb

## 2016-02-20 DIAGNOSIS — E538 Deficiency of other specified B group vitamins: Secondary | ICD-10-CM

## 2016-02-20 DIAGNOSIS — D708 Other neutropenia: Secondary | ICD-10-CM

## 2016-02-20 DIAGNOSIS — D709 Neutropenia, unspecified: Secondary | ICD-10-CM | POA: Diagnosis not present

## 2016-02-20 LAB — CBC & DIFF AND RETIC
BASO%: 0.4 % (ref 0.0–2.0)
BASOS ABS: 0 10*3/uL (ref 0.0–0.1)
EOS%: 3.8 % (ref 0.0–7.0)
Eosinophils Absolute: 0.1 10*3/uL (ref 0.0–0.5)
HEMATOCRIT: 38.9 % (ref 34.8–46.6)
HGB: 12.9 g/dL (ref 11.6–15.9)
Immature Retic Fract: 5.6 % (ref 1.60–10.00)
LYMPH#: 0.9 10*3/uL (ref 0.9–3.3)
LYMPH%: 38.5 % (ref 14.0–49.7)
MCH: 29.5 pg (ref 25.1–34.0)
MCHC: 33.2 g/dL (ref 31.5–36.0)
MCV: 88.8 fL (ref 79.5–101.0)
MONO#: 0.5 10*3/uL (ref 0.1–0.9)
MONO%: 19.2 % — AB (ref 0.0–14.0)
NEUT%: 38.1 % — ABNORMAL LOW (ref 38.4–76.8)
NEUTROS ABS: 0.9 10*3/uL — AB (ref 1.5–6.5)
Platelets: 314 10*3/uL (ref 145–400)
RBC: 4.38 10*6/uL (ref 3.70–5.45)
RDW: 12.8 % (ref 11.2–14.5)
RETIC %: 1.17 % (ref 0.70–2.10)
RETIC CT ABS: 51.25 10*3/uL (ref 33.70–90.70)
WBC: 2.4 10*3/uL — AB (ref 3.9–10.3)

## 2016-02-20 LAB — COMPREHENSIVE METABOLIC PANEL
ALT: 14 U/L (ref 0–55)
AST: 19 U/L (ref 5–34)
Albumin: 3.8 g/dL (ref 3.5–5.0)
Alkaline Phosphatase: 79 U/L (ref 40–150)
Anion Gap: 8 mEq/L (ref 3–11)
BUN: 10.2 mg/dL (ref 7.0–26.0)
CHLORIDE: 106 meq/L (ref 98–109)
CO2: 25 meq/L (ref 22–29)
Calcium: 9.5 mg/dL (ref 8.4–10.4)
Creatinine: 1.2 mg/dL — ABNORMAL HIGH (ref 0.6–1.1)
EGFR: 78 mL/min/{1.73_m2} — ABNORMAL LOW (ref >90)
Glucose: 87 mg/dl (ref 70–140)
POTASSIUM: 4.8 meq/L (ref 3.5–5.1)
Sodium: 139 mEq/L (ref 136–145)
Total Bilirubin: 0.34 mg/dL (ref 0.20–1.20)
Total Protein: 7.7 g/dL (ref 6.4–8.3)

## 2016-02-20 LAB — LACTATE DEHYDROGENASE: LDH: 152 U/L (ref 125–245)

## 2016-02-20 LAB — CHCC SMEAR

## 2016-02-20 NOTE — Telephone Encounter (Signed)
Labs added for today, per 02/20/16 los. Appointments scheduled, per 02/20/16 los. A copy of the AVS report and appointment schedule was given to the patient, per 02/20/16 los.

## 2016-02-20 NOTE — Progress Notes (Signed)
Marland Kitchen    HEMATOLOGY/ONCOLOGY CONSULTATION NOTE  Date of Service: 02/20/2016  PCP: Sedalia Muta, FNP CHIEF COMPLAINTS/PURPOSE OF CONSULTATION:  Isolated Neutropenia  HISTORY OF PRESENTING ILLNESS:   Catherine Willis is a wonderful 20 y.o. female who has been referred to [place] by Dr .Sedalia Muta, FNP for evaluation and management of isolated neutropenia.  Patient has a history of asthma, seasonal allergies, yeast infection and was recently seen by neurology or issues with headaches since October 2017 which were thought to be tension headache versus migraine headaches versus secondary headaches from excess alcohol use. She had imaging of her brain that showed no acute abnormalities could explain her headaches. As part of her lab work she had a CBC with differential on 01/22/2016 that showed a normal hemoglobin of 13.8 with an MCV of 90, normal platelet count of 272k and mild leukopenia of 2.3k with significant neutropenia of 0.6k. Patient has not had any issues with serious infections. No infections that have had her a hospitalized. No issues with infections as a child. Has had a few yeast infections over the last couple of years needing treatment.   Patient was previously drinking alcohol excessively for the past 2-3 years, sometimes drinking 1 bottle of wine per day, up to 3 times per week. She averages 14 drinks per week or more.  She notes that over the last month or so she has cut down her alcohol use to a couple of drinks on the weekends and has been taking a multivitamin .   Her labs had shown a borderline low B12 of 228 and TSH was within normal limits. HIV test was nonreactive RPR negative  she was referred to [place] for further evaluation of her neutropenia/leukopenia .  Her repeat labs with her neurologist on 01/31/2016 showed improvement in her WBC count from 2.3 k up to 2.8 k. Her [place] went from 600 up to 1120.  No fevers no chills no night sweats no enlarged  lymph nodes. No abdominal pain.   no headaches at this time.  Hasn't used some ibuprofen when necessary over-the-counter but denies any other over-the-counter medications or supplements.  No rashes no joint pain or swelling no other features suggesting an overt autoimmune syndrome.  MEDICAL HISTORY:  Past Medical History:  Diagnosis Date  . Allergy   . Asthma   . Bacterial vaginosis   . Yeast infection     SURGICAL HISTORY: Past Surgical History:  Procedure Laterality Date  . NO PAST SURGERIES      SOCIAL HISTORY: Social History   Social History  . Marital status: Single    Spouse name: N/A  . Number of children: 0  . Years of education: 22   Occupational History  .      student, visual arts   Social History Main Topics  . Smoking status: Former Research scientist (life sciences)  . Smokeless tobacco: Never Used     Comment: smoked very little  . Alcohol use Yes     Comment: 01/21/16 at leat 14 drinks/week : wine/liquor  . Drug use: No     Comment: 01/21/16 marijuana in past  . Sexual activity: Yes    Birth control/ protection: Condom   Other Topics Concern  . Not on file   Social History Narrative   Lives with 2 friends   Caffeine - coffee- 3 cups a week    FAMILY HISTORY: Family History  Problem Relation Age of Onset  . Arthritis Mother   . Hypertension Father   .  Gout Father   . Arthritis Maternal Grandmother   . Cancer Maternal Grandmother     leukemia  . Cancer Paternal Grandfather     esophageal    ALLERGIES:  has No Known Allergies.  MEDICATIONS:  Current Outpatient Prescriptions  Medication Sig Dispense Refill  . ibuprofen (ADVIL,MOTRIN) 600 MG tablet Take 1 tablet (600 mg total) by mouth every 6 (six) hours as needed. 30 tablet 0  . albuterol (PROVENTIL HFA;VENTOLIN HFA) 108 (90 BASE) MCG/ACT inhaler Inhale 2 puffs into the lungs every 6 (six) hours as needed for wheezing or shortness of breath (asthma).     No current facility-administered medications for this  visit.     REVIEW OF SYSTEMS:    10 Point review of Systems was done is negative except as noted above.  PHYSICAL EXAMINATION: ECOG PERFORMANCE STATUS: 0 - Asymptomatic  . Vitals:   02/20/16 1134  BP: 124/81  Pulse: 69  Resp: 18  Temp: 98.3 F (36.8 C)   Filed Weights   02/20/16 1134  Weight: 212 lb 8 oz (96.4 kg)   .Body mass index is 32.79 kg/m.  GENERAL: alert, in no acute distress and comfortable SKIN: skin color, texture, turgor are normal, no rashes or significant lesions EYES: normal, conjunctiva are pink and non-injected, sclera clear OROPHARYNX: no exudate, no erythema and lips, buccal mucosa, and tongue normal  NECK: supple, no JVD, thyroid normal size, non-tender, without nodularity LYMPH:  no palpable lymphadenopathy in the cervical, axillary or inguinal LUNGS: clear to auscultation with normal respiratory effort. HEART: regular rate & rhythm,  no murmurs and no lower extremity edema ABDOMEN: abdomen soft, non-tender, normoactive bowel sounds , No palpable hepatosplenomegaly. Musculoskeletal: no cyanosis of digits and no clubbing  PSYCH: alert & oriented x 3 with fluent speech NEURO: no focal motor/sensory deficits  LABORATORY DATA:  I have reviewed the data as listed  . CBC Latest Ref Rng & Units 01/31/2016 01/29/2016 01/22/2016  WBC 3.8 - 10.8 K/uL 2.8(L) 3.2(A) 2.3(LL)  Hemoglobin 11.7 - 15.5 g/dL 12.9 13.9 -  Hematocrit 35.0 - 45.0 % 38.8 39.4 41.4  Platelets 140 - 400 K/uL 360 - 372    . CMP Latest Ref Rng & Units 01/21/2016  Glucose 65 - 99 mg/dL 79  BUN 6 - 20 mg/dL 8  Creatinine 0.57 - 1.00 mg/dL 0.84  Sodium 134 - 144 mmol/L 141  Potassium 3.5 - 5.2 mmol/L 5.1  Chloride 96 - 106 mmol/L 101  CO2 18 - 29 mmol/L 25  Calcium 8.7 - 10.2 mg/dL 9.8  Total Protein 6.0 - 8.5 g/dL 7.7  Total Bilirubin 0.0 - 1.2 mg/dL 0.3  Alkaline Phos 39 - 117 IU/L 73  AST 0 - 40 IU/L 24  ALT 0 - 32 IU/L 12   Component     Latest Ref Rng & Units 01/21/2016  01/29/2016 01/31/2016  WBC     3.8 - 10.8 K/uL   2.8 (L)  RBC     3.80 - 5.10 MIL/uL   4.34  Hemoglobin     11.7 - 15.5 g/dL   12.9  HCT     35.0 - 45.0 %   38.8  MCV     80.0 - 100.0 fL   89.4  MCH     27.0 - 33.0 pg   29.7  MCHC     32.0 - 36.0 g/dL   33.2  RDW     11.0 - 15.0 %   13.2  Platelets  140 - 400 K/uL   360  MPV     7.5 - 12.5 fL   10.6  NEUT#     1,500 - 7,800 cells/uL   1,120 (L)  Lymphocyte #     850 - 3,900 cells/uL   1,008  Monocyte #     200 - 950 cells/uL   616  Eosinophils Absolute     15 - 500 cells/uL   56  Basophils Absolute     0 - 200 cells/uL   0  Neutrophils     %   40  Lymphocytes     %   36  Monocytes Relative     %   22  Eosinophil     %   2  Basophil     %   0  Smear Review        Criteria for review not met  CT Probe RNA       NOT DETECTED   GC Probe RNA       NOT DETECTED   Vitamin B12     211 - 946 pg/mL 228    TSH     0.450 - 4.500 uIU/mL 0.990    HIV     NONREACTIVE  NONREACTIVE   RPR     NON REAC  NON REAC     RADIOGRAPHIC STUDIES: I have personally reviewed the radiological images as listed and agreed with the findings in the report.  ASSESSMENT & PLAN:   20 year old African-American female with   #1 leukopenia / isolated neutropenia .  This appears to be primarily related to her alcohol abuse . Her [place] has improved from 600 now up to 1120 since having reduce her alcohol use . She also had low normal B12 levels which could be a factor . She has been taking multivitamins for the last 1 month and was recommended to add vitamin B complex to this . Cannot rule out a passing viral infection since the patient has some sinus issues which she attributes to seasonal allergies . No issues with acute infections or symptoms suggesting congenital neutropenia .  No symptomatology suggestive of a lymphoproliferative syndrome or autoimmune condition at this time . No overt medications that are typically associated with  neutropenia.  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 PLAN -Patient counseled on complete alcohol abstinence for now . -Continue multivitamin and could add vitamin B complex pills daily . She was informed about this . -Will complete workup with below labs  . Orders Placed This Encounter  Procedures  . CBC & Diff and Retic    Standing Status:   Future    Standing Expiration Date:   02/19/2017  . Comprehensive metabolic panel    Standing Status:   Future    Standing Expiration Date:   02/19/2017  . Lactate dehydrogenase    Standing Status:   Future    Standing Expiration Date:   02/19/2017  . Sedimentation rate    Standing Status:   Future    Standing Expiration Date:   02/19/2017  . Smear    Standing Status:   Future    Standing Expiration Date:   02/19/2017  . Hepatitis C antibody    Standing Status:   Future    Standing Expiration Date:   02/19/2017  . CBC & Diff and Retic    Standing Status:   Future  Standing Expiration Date:   02/19/2017  . Comprehensive metabolic panel    Standing Status:   Future    Standing Expiration Date:   02/19/2017   - No indication for G-CSF at this time. - Unless anything significantly concerning is noted on labs today we will plan to see her back in 4-6 months with repeat CBC and CMP to monitor her counts and to perform an interval evaluation to ensure no other clinical in entities unfolding.  All of the patients questions were answerto her apparent satisfaction. The patient knows to call the clinic with any problems, questions or concerns.  I spent 45 minutes counseling the patient face to face. The total time spent in the appointment was 60 minutes and more than 50% was on counseling and direct patient cares.    Sullivan Lone MD [place] AAHIVMS [facility] Hematology/Oncology Physician [facility]  (Office):       [phone] (Work  cell):  [phone] (Fax):           [phone]  02/20/2016 11:39 AM

## 2016-02-21 LAB — SEDIMENTATION RATE: Sedimentation Rate-Westergren: 7 mm/hr (ref 0–32)

## 2016-02-21 LAB — HEPATITIS C ANTIBODY: Hep C Virus Ab: <0.1 s/co ratio (ref 0.0–0.9)

## 2016-05-19 ENCOUNTER — Ambulatory Visit: Payer: No Typology Code available for payment source | Admitting: Diagnostic Neuroimaging

## 2016-05-20 ENCOUNTER — Encounter: Payer: Self-pay | Admitting: Diagnostic Neuroimaging

## 2016-07-02 ENCOUNTER — Other Ambulatory Visit: Payer: Self-pay | Admitting: Physician Assistant

## 2016-07-02 NOTE — Telephone Encounter (Signed)
PATIENT CALLED TO SEE IF HER REQUEST FOR HER ALBUTEROL SULFATE INHALER HAD BEEN REFILLED BY STEPHANIE YET? SHE SAID SHE HAS SEASONAL ALLERGIES AND ASTHMA. SHE THOUGHT SHE HAD ANOTHER INHALER BUT SHE DIDN'T. LAST NIGHT SHE HAD HEAVY CONGESTION AND THE INHALER USUALLY RELIEVES THE CONGESTION IMMEDIATELY.  BEST PHONE [phone] (CELL) PHARMACY CHOICE IS [facility] [address] M[place]

## 2016-07-27 ENCOUNTER — Other Ambulatory Visit: Payer: PRIVATE HEALTH INSURANCE

## 2016-07-27 ENCOUNTER — Ambulatory Visit: Payer: PRIVATE HEALTH INSURANCE | Admitting: Hematology

## 2017-06-23 ENCOUNTER — Encounter: Payer: Self-pay | Admitting: Physician Assistant

## 2017-07-07 ENCOUNTER — Other Ambulatory Visit: Payer: Self-pay | Admitting: Physician Assistant

## 2017-07-07 NOTE — Telephone Encounter (Signed)
Eyob w/[facility] in [place] [phone] said the patient really needs this prescription and to please call it in ASAP.

## 2017-07-08 NOTE — Telephone Encounter (Signed)
Proair refill Last OV: cannot find where addressed at OV- Last time seen in office was 01/29/2016 Last Refill:07/03/16 8.5g no refills Pharmacy:[facility] [address], [place] PCP: Benjiman CoreBrittany Wiseman filled drug last time

## 2017-07-08 NOTE — Telephone Encounter (Signed)
Please advise/ refill - PROAIR HFA 108 (90 Base) MCG/ACT inhaler  Pt last seen on 01/29/2016 Last refill 07/03/16 See message  pt pharmacy in [place]

## 2017-09-06 IMAGING — CT CT HEAD W/O CM
3 series · 16 of 47 positions shown, 19 images · non-contrast
Comparison: None.

CLINICAL DATA: 20-year-old female with numbness and tingling behind
her right ear.

EXAM:
CT HEAD WITHOUT CONTRAST
TECHNIQUE: Contiguous axial images were obtained from the base of the skull
through the vertex without intravenous contrast.

[Series 2: head 5.0 h30s · axial · 0.45mm/px · z∈[-89,+36]mm · 10 of 31 slices shown, 13 images]
[im 3/31  brain]
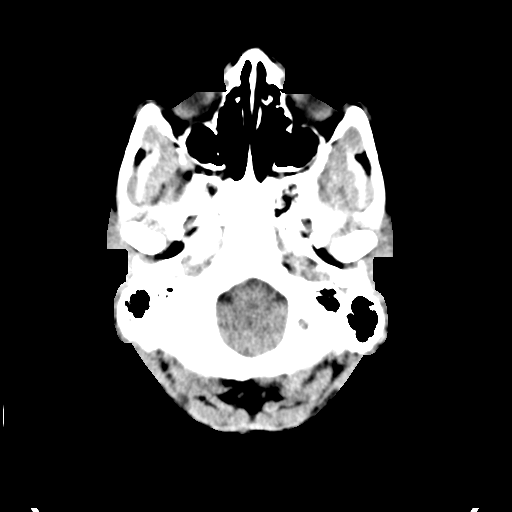
[im 3/31  bone]
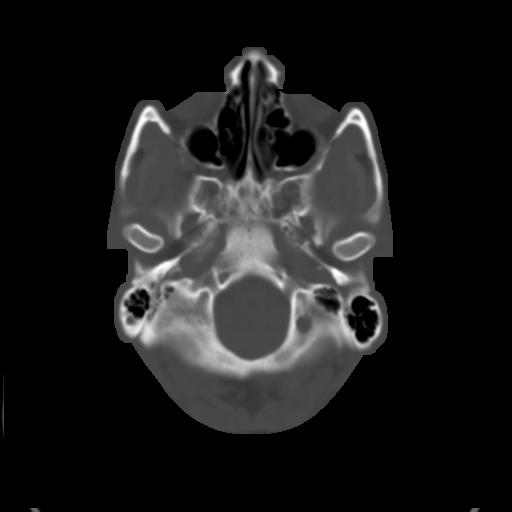
[im 6/31  brain]
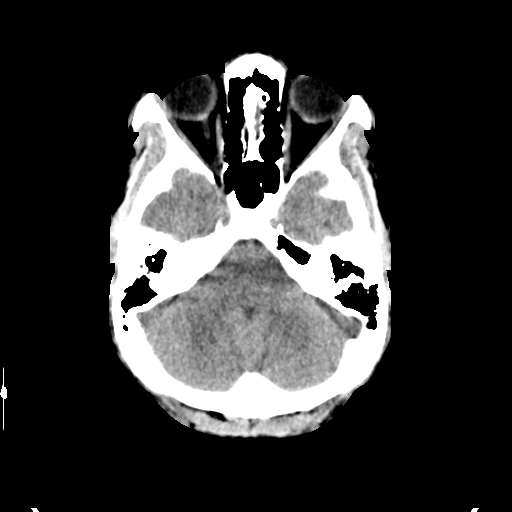
[im 9/31  brain]
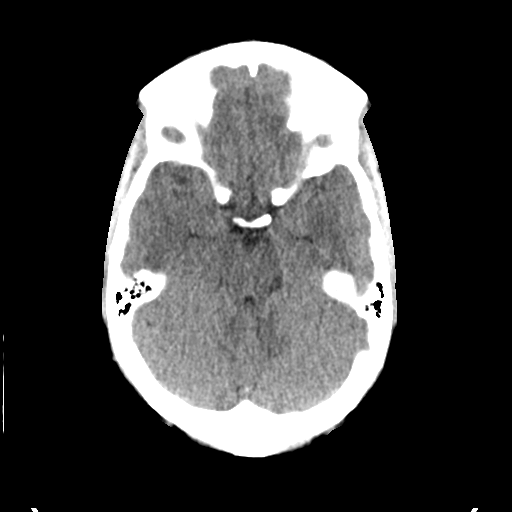
[im 11/31  brain]
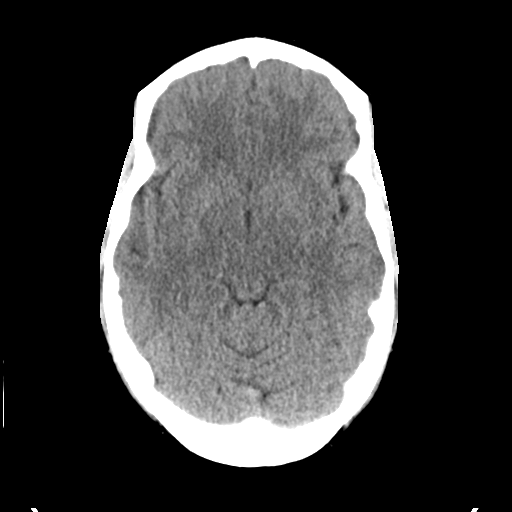
[im 14/31  brain]
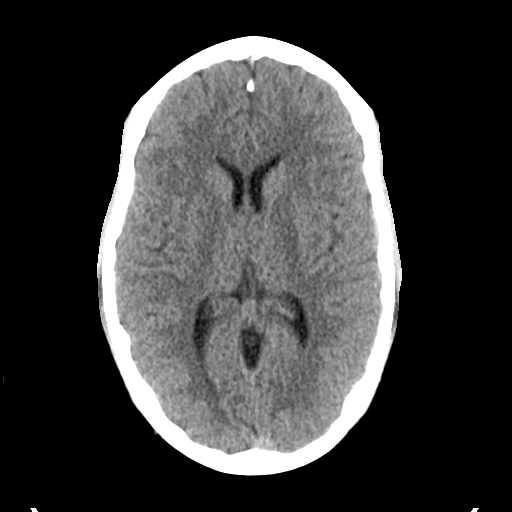
[im 14/31  bone]
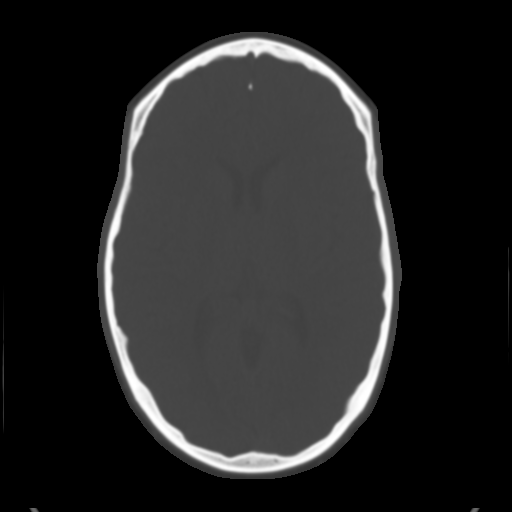
[im 17/31  brain]
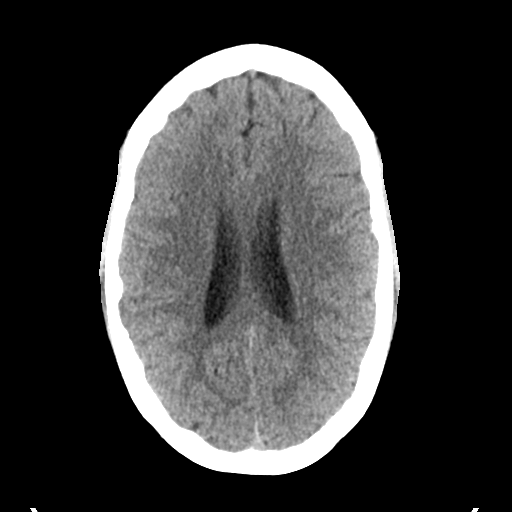
[im 20/31  brain]
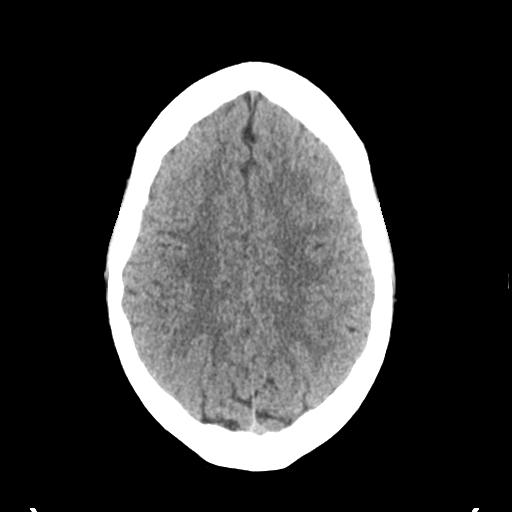
[im 23/31  brain]
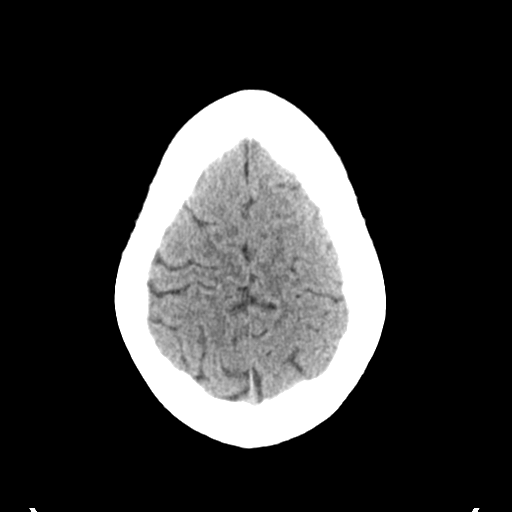
[im 25/31  brain]
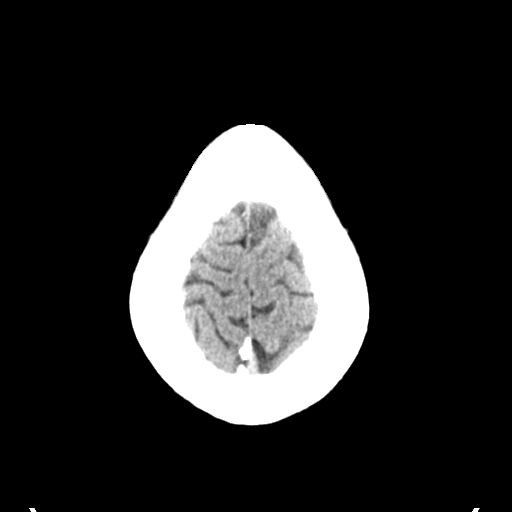
[im 25/31  bone]
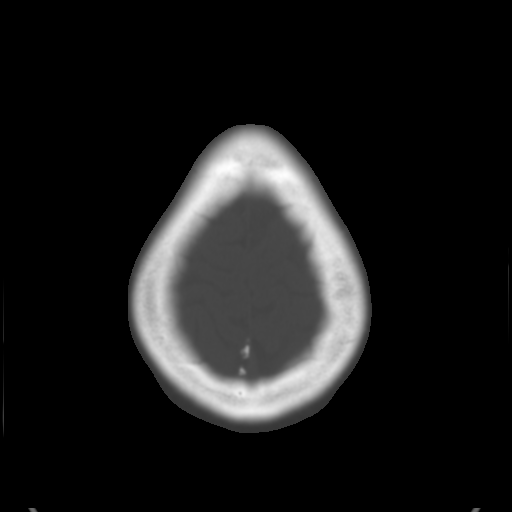
[im 28/31  brain]
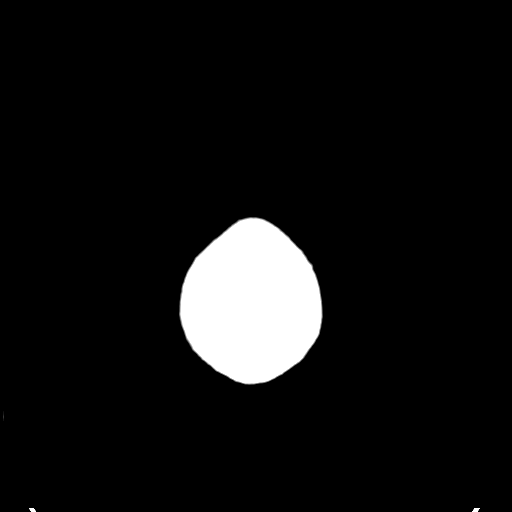

[Series 4: head 3.0 mpr · coronal · 0.31mm/px · 3 of 68 slices shown]
[im 23/68  brain]
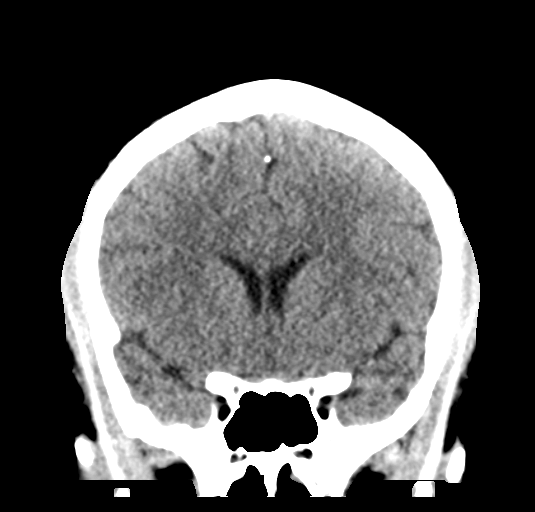
[im 30/68  brain]
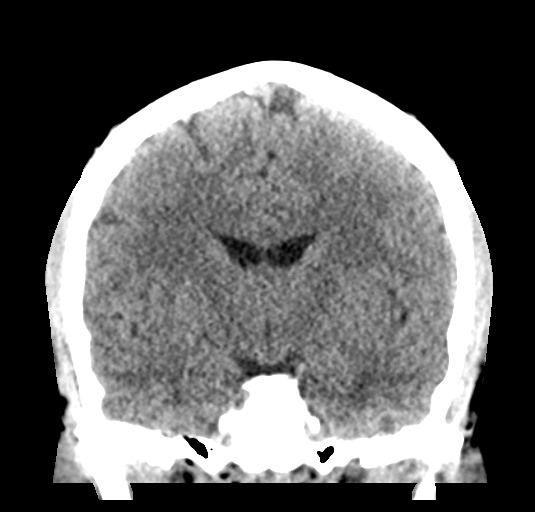
[im 38/68  brain]
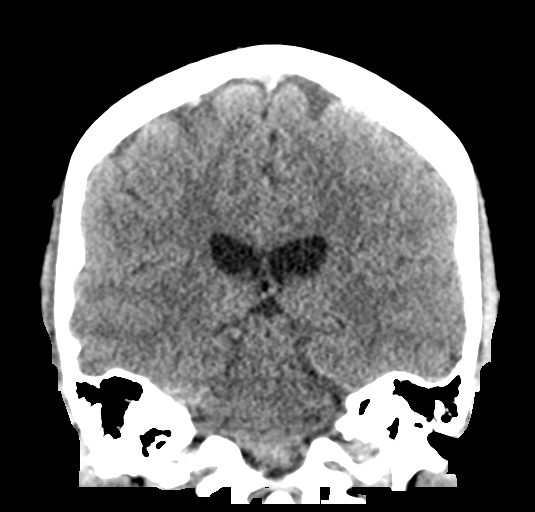

[Series 5: head 3.0 mpr sag · sagittal · 0.33mm/px · 3 of 52 slices shown]
[im 18/52  brain]
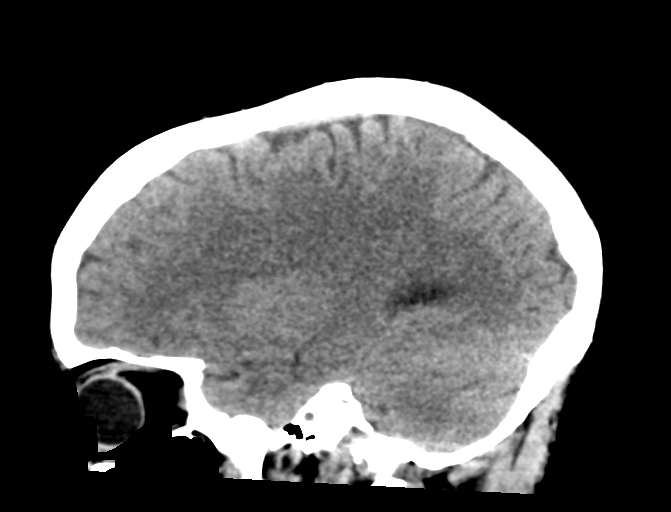
[im 26/52  brain]
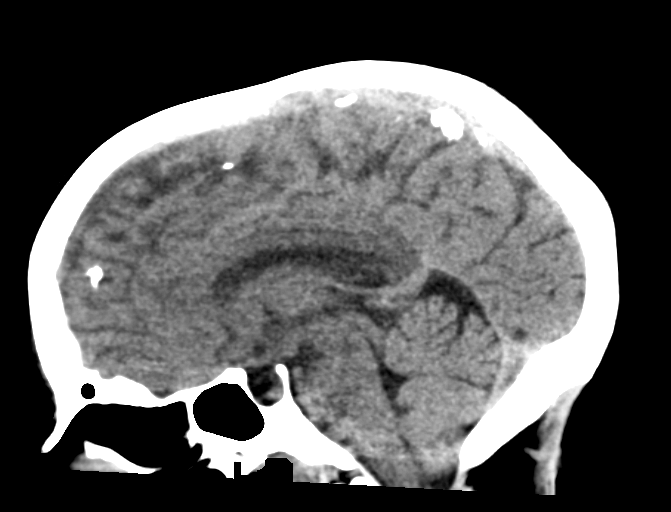
[im 35/52  brain]
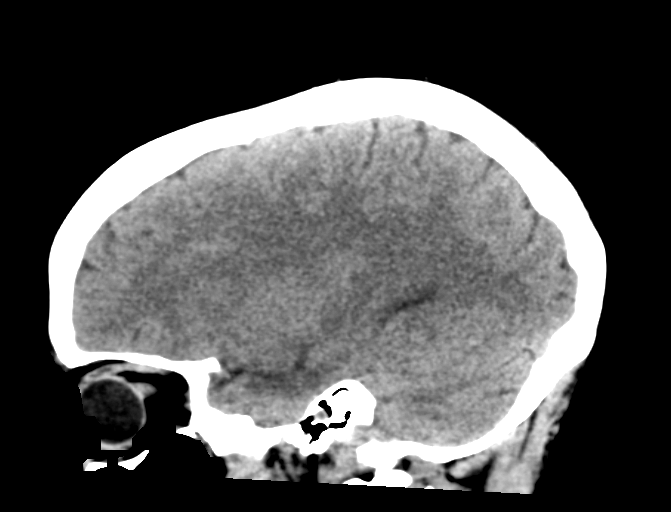

[16 of 47 positions shown; findings below may reference images not displayed]

FINDINGS: Brain: No evidence of acute infarction, hemorrhage, hydrocephalus,
extra-axial collection or mass lesion/mass effect.

Vascular: No hyperdense vessel or unexpected calcification.

Skull: Normal. Negative for fracture or focal lesion.
Dolichocephalic.

Sinuses/Orbits: No acute finding.

Other: None.
IMPRESSION: No acute intracranial pathology.

## 2022-09-02 ENCOUNTER — Encounter (INDEPENDENT_AMBULATORY_CARE_PROVIDER_SITE_OTHER): Payer: Self-pay | Admitting: Obstetrics and Gynecology

## 2022-09-09 ENCOUNTER — Ambulatory Visit (INDEPENDENT_AMBULATORY_CARE_PROVIDER_SITE_OTHER): Payer: BLUE CROSS/BLUE SHIELD | Admitting: Obstetrics and Gynecology

## 2022-09-09 ENCOUNTER — Encounter (INDEPENDENT_AMBULATORY_CARE_PROVIDER_SITE_OTHER): Payer: Self-pay | Admitting: Obstetrics and Gynecology

## 2022-09-09 VITALS — BP 133/85 | HR 73 | Temp 98.8°F | Resp 16 | Ht 68.0 in | Wt 232.2 lb

## 2022-09-09 DIAGNOSIS — D250 Submucous leiomyoma of uterus: Secondary | ICD-10-CM

## 2022-09-09 DIAGNOSIS — D251 Intramural leiomyoma of uterus: Secondary | ICD-10-CM

## 2022-09-09 DIAGNOSIS — D252 Subserosal leiomyoma of uterus: Secondary | ICD-10-CM

## 2022-09-09 NOTE — Progress Notes (Signed)
CHIEF COMPLAINT  Chief Complaint   Patient presents with    Gynecologic Exam     Pt had CT scan 08/20/22 and MRI 09/01/22, found 6 fibroids. Denies pain and AUB, can feel a mass in abdomen          Alicia King is 27 y.o. G0P0000 here for consult re fibroids  Felt mass in abdomen last week  No issues with periods. Menses regular. Flow is moderate  No pelvic pain     Outside imaging   MRI   Ut 18.6 x10cm, ROV high right sidewall 4.5 2.6 4.6, LOV 4.4 2 4.2  Fibroids x7:   IM/SS anterior body 10.6 x 12.9 x 13.1  Pedunculated left 5.3 x 6.4 x 6.5cm  Pedunculated left posterior 7cm x 5cm x 6 cm  IM  right post, LUS 4.5 x 4.8 x 6cm  SS right LUS 3.5 x 3.4 x 4.3cm  IM left mid posterior 2.3 x 2.5 x 2.7cm  SM left fundal 0.8 x 1 x 1cm    CT   Enlarged fibroid uterus 13 18 5.5cm with large left exophytic fibroid       Gynecologic Exam  The patient's pertinent negatives include no pelvic pain. Associated symptoms include frequency.        Review of Systems   Genitourinary:  Positive for frequency. Negative for difficulty urinating, menstrual problem and pelvic pain.         Prior tx  none  ?  No results found for: "HGB", "HCT", "PLT", "TSH", "FSH"    Previous imaging  As above    Prior path  None    OB History       Gravida   0    Para   0    Term   0    Preterm   0    AB   0    Living   0         SAB   0    IAB   0    Ectopic   0    Multiple   0    Live Births   0                  Relevant medical history reviewed/updated.    No Known Allergies   No current outpatient medications on file prior to visit.     No current facility-administered medications on file prior to visit.        BP 133/85   Pulse 73   Temp 98.8 F (37.1 C)   Resp 16   Ht 5\' 8"  (1.727 m)   Wt 232 lb 3.2 oz (105.3 kg)   LMP 09/09/2022 (Exact Date)   BMI 35.31 kg/m   Physical Exam  Constitutional:       Appearance: Normal appearance.   Genitourinary:      Urethral meatus normal.      Genitourinary Comments: 18-20 week sized uterus, multiple uterine  fibroids, limited mobility       Right Labia: No rash or skin changes.     Left Labia: No skin changes or rash.     No vaginal discharge or bleeding.      No vaginal prolapse present.       Right Adnexa: not tender, not full and no mass present.     Left Adnexa: not tender, not full and no mass present.     No cervical motion tenderness, discharge or friability.  Uterus is enlarged.      Uterus is not tender.      Uterine mass present.     Bladder is not tender.       Pelvic exam was performed with patient in the lithotomy position.   Neurological:      Mental Status: She is alert.   Vitals and nursing note reviewed.            ASSESSMENT  27 y.o. G0P0000   1. Intramural, submucous, and subserous leiomyoma of uterus       Bulk symptoms only   Desires surgical management  Reviewed minimally invasive vs open myomectomy would depend on multiple factors. Recommended referral to [facility] for surgical evaluation  Discussed implications of fibroids and uterine surgery for future pregnancy     ?  PLAN  [facility] referral    Orders Placed This Encounter    Referral to [facility] ([facility]) ([place])          Selinda Eon, MD

## 2022-09-23 ENCOUNTER — Institutional Professional Consult (permissible substitution) (INDEPENDENT_AMBULATORY_CARE_PROVIDER_SITE_OTHER): Payer: BLUE CROSS/BLUE SHIELD | Admitting: Obstetrics & Gynecology

## 2022-09-23 NOTE — Progress Notes (Deleted)
Referred by: Dr. Irving    Chief Complaint: No chief complaint on file.      HPI:  Alicia King is a 27 y.o. G0P0000 female, presenting for consultation for fibroid uterus.           MRI   Ut 18.6 x10cm, ROV high right sidewall 4.5 2.6 4.6, LOV 4.4 2 4.2  Fibroids x7:   IM/SS anterior body 10.6 x 12.9 x 13.1  Pedunculated left 5.3 x 6.4 x 6.5cm  Pedunculated left posterior 7cm x 5cm x 6 cm  IM  right post, LUS 4.5 x 4.8 x 6cm  SS right LUS 3.5 x 3.4 x 4.3cm  IM left mid posterior 2.3 x 2.5 x 2.7cm  SM left fundal 0.8 x 1 x 1cm  Gynecologic History:  Patient's last menstrual period was 09/09/2022 (exact date).    Gynecological History                                              Social History     Substance and Sexual Activity   Sexual Activity Yes    Partners: Male    Birth control/protection: None       Menses are ***  *** h/o STDs and *** h/o abnormal pap smears  Is *** sexually active.  Partners are ***female    Review of Systems:  General: no fatigue, fever/chills, weight loss  Gastrointestinal: no abdominal pain, dyschezia, hematochezia, diarrhea, constipation  Genitourinary: no dysuria, trouble voiding, or hematuria    Patient Active Problem List   Diagnosis    Intramural, submucous, and subserous leiomyoma of uterus       Past Medical History:   Diagnosis Date    Asthma     Fibroid, uterine            No past surgical history on file.    Social History     Socioeconomic History    Marital status: Single   Tobacco Use    Smoking status: Former     Types: Cigarettes     Passive exposure: Never    Smokeless tobacco: Never   Substance and Sexual Activity    Alcohol use: Yes     Alcohol/week: 2.0 standard drinks of alcohol     Types: 2 Standard drinks or equivalent per week    Drug use: Yes     Types: Marijuana    Sexual activity: Yes     Partners: Male     Birth control/protection: None     Social Determinants of Health     Financial Resource Strain: Low Risk  (09/09/2022)    Overall Financial Resource  Strain (CARDIA)     Difficulty of Paying Living Expenses: Not hard at all   Food Insecurity: No Food Insecurity (09/09/2022)    Hunger Vital Sign     Worried About Running Out of Food in the Last Year: Never true     Ran Out of Food in the Last Year: Never true   Transportation Needs: No Transportation Needs (09/09/2022)    PRAPARE - Transportation     Lack of Transportation (Medical): No     Lack of Transportation (Non-Medical): No   Physical Activity: Sufficiently Active (09/09/2022)    Exercise Vital Sign     Days of Exercise per Week: 5 days     Minutes of Exercise per Session:   80 min   Stress: Stress Concern Present (09/09/2022)    [employer] - Occupational Stress Questionnaire     Feeling of Stress : To some extent   Social Connections: Socially Isolated (09/09/2022)    Social Connection and Isolation Panel [NHANES]     Frequency of Communication with Friends and Family: Three times a week     Frequency of Social Gatherings with Friends and Family: Three times a week     Attends Religious Services: Never     Active Member of Clubs or Organizations: No     Attends Club or Organization Meetings: Never     Marital Status: Never married   Intimate Partner Violence: Not At Risk (09/09/2022)    Humiliation, Afraid, Rape, and Kick questionnaire     Fear of Current or Ex-Partner: No     Emotionally Abused: No     Physically Abused: No     Sexually Abused: No   Housing Stability: Unknown (09/09/2022)    Housing Stability Vital Sign     Unable to Pay for Housing in the Last Year: No     Unstable Housing in the Last Year: No       Family History   Problem Relation Age of Onset    Fibroids Mother     Hypertension Father     Leukemia Maternal Grandmother     Lung cancer Paternal Grandfather     Seizures Neg Hx     Stroke Neg Hx     Diabetes Neg Hx        No Known Allergies    No current outpatient medications on file prior to visit.     No current facility-administered medications on file prior to  visit.       Recent Labs:  ***  No results found for: "CBC", "HCT", "CREATININE"     Recent Imaging:  Radiology Results (39 wks)       ** No results found for the last 6552 hours. **              Physical Exam:  LMP 09/09/2022 (Exact Date)     General appearance: ambulatory, alert, well-appearing, no distress  Abdomen: soft, nontender, nondistended, no hernia, no masses  Prior incisions: {incision options:54630}   Extremities: no clubbing, edema, varicosities, bruising   Vulva: normal external genitalia  Vagina: normal appearing mucosa and physiologic discharge  Cervix: normal appearing cervix without discharge or lesions.   Uterus: uterus is *** size, {uterine version:54628}, mobile, nontender  Adnexa:  nontender and no masses.  Rectal: {rectal exam:54629}  Pelvic floor: {pelvic floor (Optional):54627}    Assessment:  No diagnosis found.    Plan:  ***  All questions answered    No orders of the defined types were placed in this encounter.    No orders of the defined types were placed in this encounter.      I, Filemon Breton Acosta, attest that the time I spent in direct and indirect patient care and documentation today was *** mins.      Azure Barrales Acosta Diaz, MD, FACOG  Minimally Invasive Gyn Surgery  [facility]    [address]  [address]  [place], [place] [address]  P: [phone]  F: [phone]

## 2022-09-23 NOTE — Progress Notes (Deleted)
Referred by: Dr. Madie Reno    Chief Complaint: No chief complaint on file.      HPI:  Alicia King is a 27 y.o. G0P0000 female, presenting for consultation for fibroid uterus.           MRI   Ut 18.6 x10cm, ROV high right sidewall 4.5 2.6 4.6, LOV 4.4 2 4.2  Fibroids x7:   IM/SS anterior body 10.6 x 12.9 x 13.1  Pedunculated left 5.3 x 6.4 x 6.5cm  Pedunculated left posterior 7cm x 5cm x 6 cm  IM  right post, LUS 4.5 x 4.8 x 6cm  SS right LUS 3.5 x 3.4 x 4.3cm  IM left mid posterior 2.3 x 2.5 x 2.7cm  SM left fundal 0.8 x 1 x 1cm  Gynecologic History:  Patient's last menstrual period was 09/09/2022 (exact date).    Gynecological History                                              Social History     Substance and Sexual Activity   Sexual Activity Yes    Partners: Male    Birth control/protection: None       Menses are ***  *** h/o STDs and *** h/o abnormal pap smears  Is *** sexually active.  Partners are ***female    Review of Systems:  General: no fatigue, fever/chills, weight loss  Gastrointestinal: no abdominal pain, dyschezia, hematochezia, diarrhea, constipation  Genitourinary: no dysuria, trouble voiding, or hematuria    Patient Active Problem List   Diagnosis    Intramural, submucous, and subserous leiomyoma of uterus       Past Medical History:   Diagnosis Date    Asthma     Fibroid, uterine            No past surgical history on file.    Social History     Socioeconomic History    Marital status: Single   Tobacco Use    Smoking status: Former     Types: Cigarettes     Passive exposure: Never    Smokeless tobacco: Never   Substance and Sexual Activity    Alcohol use: Yes     Alcohol/week: 2.0 standard drinks of alcohol     Types: 2 Standard drinks or equivalent per week    Drug use: Yes     Types: Marijuana    Sexual activity: Yes     Partners: Male     Birth control/protection: None     Social Determinants of Health     Financial Resource Strain: Low Risk  (09/09/2022)    Overall Financial Resource  Strain (CARDIA)     Difficulty of Paying Living Expenses: Not hard at all   Food Insecurity: No Food Insecurity (09/09/2022)    Hunger Vital Sign     Worried About Running Out of Food in the Last Year: Never true     Ran Out of Food in the Last Year: Never true   Transportation Needs: No Transportation Needs (09/09/2022)    PRAPARE - Therapist, art (Medical): No     Lack of Transportation (Non-Medical): No   Physical Activity: Sufficiently Active (09/09/2022)    Exercise Vital Sign     Days of Exercise per Week: 5 days     Minutes of Exercise per Session:  80 min   Stress: Stress Concern Present (09/09/2022)    [employer] of Occupational Health - Occupational Stress Questionnaire     Feeling of Stress : To some extent   Social Connections: Socially Isolated (09/09/2022)    Social Connection and Isolation Panel [NHANES]     Frequency of Communication with Friends and Family: Three times a week     Frequency of Social Gatherings with Friends and Family: Three times a week     Attends Religious Services: Never     Active Member of Clubs or Organizations: No     Attends Banker Meetings: Never     Marital Status: Never married   Intimate Partner Violence: Not At Risk (09/09/2022)    Humiliation, Afraid, Rape, and Kick questionnaire     Fear of Current or Ex-Partner: No     Emotionally Abused: No     Physically Abused: No     Sexually Abused: No   Housing Stability: Unknown (09/09/2022)    Housing Stability Vital Sign     Unable to Pay for Housing in the Last Year: No     Unstable Housing in the Last Year: No       Family History   Problem Relation Age of Onset    Fibroids Mother     Hypertension Father     Leukemia Maternal Grandmother     Lung cancer Paternal Grandfather     Seizures Neg Hx     Stroke Neg Hx     Diabetes Neg Hx        No Known Allergies    No current outpatient medications on file prior to visit.     No current facility-administered medications on file prior to  visit.       Recent Labs:  ***  No results found for: "CBC", "HCT", "CREATININE"     Recent Imaging:  Radiology Results (39 wks)       ** No results found for the last 6552 hours. **              Physical Exam:  LMP 09/09/2022 (Exact Date)     General appearance: ambulatory, alert, well-appearing, no distress  Abdomen: soft, nontender, nondistended, no hernia, no masses  Prior incisions: {incision options:54630}   Extremities: no clubbing, edema, varicosities, bruising   Vulva: normal external genitalia  Vagina: normal appearing mucosa and physiologic discharge  Cervix: normal appearing cervix without discharge or lesions.   Uterus: uterus is *** size, {uterine version:54628}, mobile, nontender  Adnexa:  nontender and no masses.  Rectal: {rectal exam:54629}  Pelvic floor: {pelvic floor (Optional):54627}    Assessment:  No diagnosis found.    Plan:  ***  All questions answered    No orders of the defined types were placed in this encounter.    No orders of the defined types were placed in this encounter.      I, Ander Gaster, attest that the time I spent in direct and indirect patient care and documentation today was *** mins.      Venetia Night, MD, FACOG  Minimally Invasive [facility]  [facility]    [address]  [address]  [place], [place] [address]  P: [phone]  F: [phone]

## 2022-09-28 ENCOUNTER — Encounter (INDEPENDENT_AMBULATORY_CARE_PROVIDER_SITE_OTHER): Payer: Self-pay

## 2022-10-05 ENCOUNTER — Institutional Professional Consult (permissible substitution) (INDEPENDENT_AMBULATORY_CARE_PROVIDER_SITE_OTHER): Payer: BLUE CROSS/BLUE SHIELD | Admitting: Obstetrics & Gynecology

## 2022-10-05 NOTE — Progress Notes (Deleted)
Referred by: Dr. Madie Reno    Chief Complaint: No chief complaint on file.      HPI:  Alicia King is a 27 y.o. G0P0000 female, presenting for consultation for fibroid uterus.     Denies symptoms of pelvic pain or AUB, but has noted a mass in the abdomen. Was diagnosed with fibroids .    Desires Fertility:  Menses  Cycle:  How Many Days Heavy?  How often do you change protection during the heaviest flow?  Intermenstrual bleeding?  Have you ever been diagnosed with anemia?  Have you ever received a blood transfusion?    Urinary  Frequency:  Urgency:  Dysuria:  Nocturia:    GI  Constipation :  Diarrhea:  How regular?:    Sexual Function  Dyspareunia:    Other Bulk Symptoms:    Ultrasound:    MRI:  Ut 18.6 x10cm, ROV high right sidewall 4.5 2.6 4.6, LOV 4.4 2 4.2  Fibroids x7:   IM/SS anterior body 10.6 x 12.9 x 13.1  Pedunculated left 5.3 x 6.4 x 6.5cm  Pedunculated left posterior 7cm x 5cm x 6 cm  IM  right post, LUS 4.5 x 4.8 x 6cm  SS right LUS 3.5 x 3.4 x 4.3cm  IM left mid posterior 2.3 x 2.5 x 2.7cm  SM left fundal 0.8 x 1 x 1cm    Gynecologic History:  Patient's last menstrual period was 09/09/2022 (exact date).    Gynecological History                                              Social History     Substance and Sexual Activity   Sexual Activity Yes    Partners: Male    Birth control/protection: None       Menses are ***  *** h/o STDs and *** h/o abnormal pap smears  Is *** sexually active.  Partners are ***female    Review of Systems:  General: no fatigue, fever/chills, weight loss  Gastrointestinal: no abdominal pain, dyschezia, hematochezia, diarrhea, constipation  Genitourinary: no dysuria, trouble voiding, or hematuria    Patient Active Problem List   Diagnosis    Intramural, submucous, and subserous leiomyoma of uterus       Past Medical History:   Diagnosis Date    Asthma     Fibroid, uterine            No past surgical history on file.    Social History     Socioeconomic History    Marital status:  Single   Tobacco Use    Smoking status: Former     Types: Cigarettes     Passive exposure: Never    Smokeless tobacco: Never   Substance and Sexual Activity    Alcohol use: Yes     Alcohol/week: 2.0 standard drinks of alcohol     Types: 2 Standard drinks or equivalent per week    Drug use: Yes     Types: Marijuana    Sexual activity: Yes     Partners: Male     Birth control/protection: None     Social Determinants of Health     Financial Resource Strain: Low Risk  (09/09/2022)    Overall Financial Resource Strain (CARDIA)     Difficulty of Paying Living Expenses: Not hard at all   Food Insecurity: No Food  Insecurity (09/09/2022)    Hunger Vital Sign     Worried About Running Out of Food in the Last Year: Never true     Ran Out of Food in the Last Year: Never true   Transportation Needs: No Transportation Needs (09/09/2022)    PRAPARE - Therapist, art (Medical): No     Lack of Transportation (Non-Medical): No   Physical Activity: Sufficiently Active (09/09/2022)    Exercise Vital Sign     Days of Exercise per Week: 5 days     Minutes of Exercise per Session: 80 min   Stress: Stress Concern Present (09/09/2022)    [employer] of Occupational Health - Occupational Stress Questionnaire     Feeling of Stress : To some extent   Social Connections: Socially Isolated (09/09/2022)    Social Connection and Isolation Panel [NHANES]     Frequency of Communication with Friends and Family: Three times a week     Frequency of Social Gatherings with Friends and Family: Three times a week     Attends Religious Services: Never     Active Member of Clubs or Organizations: No     Attends Banker Meetings: Never     Marital Status: Never married   Intimate Partner Violence: Not At Risk (09/09/2022)    Humiliation, Afraid, Rape, and Kick questionnaire     Fear of Current or Ex-Partner: No     Emotionally Abused: No     Physically Abused: No     Sexually Abused: No   Housing Stability: Unknown  (09/09/2022)    Housing Stability Vital Sign     Unable to Pay for Housing in the Last Year: No     Unstable Housing in the Last Year: No       Family History   Problem Relation Age of Onset    Fibroids Mother     Hypertension Father     Leukemia Maternal Grandmother     Lung cancer Paternal Grandfather     Seizures Neg Hx     Stroke Neg Hx     Diabetes Neg Hx        No Known Allergies    No current outpatient medications on file prior to visit.     No current facility-administered medications on file prior to visit.       Recent Labs:  ***  No results found for: "CBC", "HCT", "CREATININE"     Recent Imaging:          Physical Exam:  LMP 09/09/2022 (Exact Date)     General appearance: ambulatory, alert, well-appearing, no distress  Abdomen: soft, nontender, nondistended, no hernia, no masses  Prior incisions: {incision options:54630}   Extremities: no clubbing, edema, varicosities, bruising   Vulva: normal external genitalia  Vagina: normal appearing mucosa and physiologic discharge  Cervix: normal appearing cervix without discharge or lesions.   Uterus: uterus is *** size, {uterine version:54628}, mobile, nontender  Adnexa:  nontender and no masses.  Rectal: {rectal exam:54629}  Pelvic floor: {pelvic floor (Optional):54627}    Assessment:  1. Intramural, submucous, and subserous leiomyoma of uterus    2. Bulky or enlarged uterus        Plan:    Reviewed pathophys of fibroids and their impact on AUB/dysmenorrhea/fertility.  Discussed treatment options divided between fertility-sparing (myomectomy) and non (hysterectomy, [place]).  Reviewed medical treatment options to manage the symptoms of fibroids including: cOCPs, progestin-only pills/implant/shot/IUD, tranexamic acid, temporary use  of GnRH analogues.  Discussed benefits of laparoscopic myomectomy and expected surgery length, outpatient status but may need overnight observation depending upon length of surgery and blood loss.  Discussed risk of complications with  surgery including: infection, damage to surrounding organs, risk for VTE, bleeding requiring treatment for anemia including transfusion, discovery of occult malignancy with potential for spread with a uterine cut-through procedure.   Discussed plan for contained morcellation to minimize spillage but this method is not perfect at preventing spread.  Discussed estimated risk occult malignancy based on history/tests/age is approx ***  Discussed expectations on DOS including outpatient status, need for 2-3 weeks off work/exercise for recovery, 1 month off strenuous exercise.  Discussed risk of fibroid recurrence requiring additional surgery may be as high as *** given her age and fibroid characteristics.  Discussed impact on fertility/pregnancy and that I will *** recommend she consider a cesarean delivery with future pregnancies.  All questions answered and pt would like to proceed with surgery ***.    All questions answered    No orders of the defined types were placed in this encounter.    No orders of the defined types were placed in this encounter.      I, Ander Gaster, attest that the time I spent in direct and indirect patient care and documentation today was *** mins.      Venetia Night, MD, FACOG  Minimally Invasive [facility]  [facility]    [address]  [address]  [place], [place] [address]  P: [phone]  F: [phone]

## 2022-10-06 ENCOUNTER — Institutional Professional Consult (permissible substitution) (INDEPENDENT_AMBULATORY_CARE_PROVIDER_SITE_OTHER): Payer: BLUE CROSS/BLUE SHIELD | Admitting: Obstetrics & Gynecology

## 2022-10-20 ENCOUNTER — Encounter (INDEPENDENT_AMBULATORY_CARE_PROVIDER_SITE_OTHER): Payer: Self-pay

## 2022-10-28 ENCOUNTER — Ambulatory Visit (INDEPENDENT_AMBULATORY_CARE_PROVIDER_SITE_OTHER): Payer: BLUE CROSS/BLUE SHIELD | Admitting: Obstetrics & Gynecology

## 2022-10-28 ENCOUNTER — Encounter (INDEPENDENT_AMBULATORY_CARE_PROVIDER_SITE_OTHER): Payer: Self-pay | Admitting: Obstetrics & Gynecology

## 2022-10-28 VITALS — BP 117/77 | HR 72 | Temp 97.9°F | Ht 68.0 in | Wt 234.1 lb

## 2022-10-28 DIAGNOSIS — D252 Subserosal leiomyoma of uterus: Secondary | ICD-10-CM

## 2022-10-28 DIAGNOSIS — D251 Intramural leiomyoma of uterus: Secondary | ICD-10-CM

## 2022-10-28 DIAGNOSIS — D250 Submucous leiomyoma of uterus: Secondary | ICD-10-CM

## 2022-10-28 DIAGNOSIS — N939 Abnormal uterine and vaginal bleeding, unspecified: Secondary | ICD-10-CM

## 2022-10-28 NOTE — Progress Notes (Signed)
Referred by: Dr. Madie Reno    Chief Complaint: Gynecologic Exam (NP: Ref by Dr. Madie Reno for fibroids. CT scan 08/20/22 and MRI 09/01/22- pt provided MRI disc in office, report in [facility]. Denies any symptoms. Feels bulge when doing physical activity. Denies desire immediate fertility- plans in the future)      HPI:  Alicia King is a 27 y.o. G0P0000 female, presenting for consultation for fibroid uterus.     Fertility is important and wants fertility sparing options.  Has monthly periods, last 6 days. Usually has 2-3 heavy days. Changes pads 4 times per day on heaviest days.     No history of anemia. Does endorse less energy than usual. Denies dizziness, lightheadedness, CP, SOB.     Has mild cramping with periods. Sometimes takes ibuprofen occasionally.     Denies dyspareunia but feels pelvic pressure.   Reports urinary frequency, but denies dysuria.   Denies constipation. Usually has looser bowel movements while on period.     MRI completed at [facility]. (Patient brought CD)    Ut 18.6 x10cm, ROV high right sidewall 4.5 2.6 4.6, LOV 4.4 2 4.2  Fibroids x7:   IM/SS anterior body 10.6 x 12.9 x 13.1  Pedunculated left 5.3 x 6.4 x 6.5cm  Pedunculated left posterior 7cm x 5cm x 6 cm  IM  right post, LUS 4.5 x 4.8 x 6cm  SS right LUS 3.5 x 3.4 x 4.3cm  IM left mid posterior 2.3 x 2.5 x 2.7cm  SM left fundal 0.8 x 1 x 1cm    Gynecologic History:    Patient's last menstrual period was 09/30/2022 (within days).    Gynecological History   Age of menarche:: 58                                             Social History     Substance and Sexual Activity   Sexual Activity Yes    Partners: Male    Birth control/protection: None         Review of Systems:  General: no fatigue, fever/chills, weight loss  Gastrointestinal: no abdominal pain, dyschezia, hematochezia, diarrhea, constipation  Genitourinary: no dysuria, trouble voiding, or hematuria    Patient Active Problem List   Diagnosis    Intramural, submucous,  and subserous leiomyoma of uterus       Past Medical History:   Diagnosis Date    Asthma     Fibroid, uterine            History reviewed. No pertinent surgical history.    Social History     Socioeconomic History    Marital status: Single   Tobacco Use    Smoking status: Former     Types: Cigarettes     Passive exposure: Never    Smokeless tobacco: Never    Tobacco comments:     Occasionally, social cigarette use.      Per pt quit a few months ago    Vaping Use    Vaping status: Never Used   Substance and Sexual Activity    Alcohol use: Yes     Alcohol/week: 2.0 standard drinks of alcohol     Types: 2 Standard drinks or equivalent per week    Drug use: Yes     Types: Marijuana    Sexual activity: Yes     Partners:  Male     Birth control/protection: None     Social Determinants of Health     Financial Resource Strain: Low Risk  (09/09/2022)    Overall Financial Resource Strain (CARDIA)     Difficulty of Paying Living Expenses: Not hard at all   Food Insecurity: No Food Insecurity (09/09/2022)    Hunger Vital Sign     Worried About Running Out of Food in the Last Year: Never true     Ran Out of Food in the Last Year: Never true   Transportation Needs: No Transportation Needs (09/09/2022)    PRAPARE - Therapist, art (Medical): No     Lack of Transportation (Non-Medical): No   Physical Activity: Sufficiently Active (09/09/2022)    Exercise Vital Sign     Days of Exercise per Week: 5 days     Minutes of Exercise per Session: 80 min   Stress: Stress Concern Present (09/09/2022)    [employer] of Occupational Health - Occupational Stress Questionnaire     Feeling of Stress : To some extent   Social Connections: Socially Isolated (09/09/2022)    Social Connection and Isolation Panel [NHANES]     Frequency of Communication with Friends and Family: Three times a week     Frequency of Social Gatherings with Friends and Family: Three times a week     Attends Religious Services: Never     Active  Member of Clubs or Organizations: No     Attends Banker Meetings: Never     Marital Status: Never married   Intimate Partner Violence: Not At Risk (09/09/2022)    Humiliation, Afraid, Rape, and Kick questionnaire     Fear of Current or Ex-Partner: No     Emotionally Abused: No     Physically Abused: No     Sexually Abused: No   Housing Stability: Unknown (09/09/2022)    Housing Stability Vital Sign     Unable to Pay for Housing in the Last Year: No     Unstable Housing in the Last Year: No       Family History   Problem Relation Age of Onset    Fibroids Mother     Hypertension Father     Leukemia Maternal Grandmother     Lung cancer Paternal Grandfather     Seizures Neg Hx     Stroke Neg Hx     Diabetes Neg Hx        No Known Allergies    No current outpatient medications on file prior to visit.     No current facility-administered medications on file prior to visit.       Recent Labs:  NA    Recent Imaging:              Physical Exam:  BP 117/77   Pulse 72   Temp 97.9 F (36.6 C)   Ht 5\' 8"  (1.727 m)   Wt 234 lb 2.1 oz (106.2 kg)   LMP 09/30/2022 (Within Days)   BMI 35.60 kg/m     General appearance: ambulatory, alert, well-appearing, no distress  Abdomen: soft, nontender, no hernia, no masses  Prior incisions: none       Assessment:  1. Abnormal uterine bleeding (AUB)    2. Intramural, submucous, and subserous leiomyoma of uterus        Plan:    Reviewed MRI showing dominant large anterior IM/SS fibroid. Has other IM fibroids. May  have a SM 1cm fibroid, but on imaging it appears to be possibly a type 3.    Reviewed pathophys of fibroids and their impact on AUB/dysmenorrhea/fertility.  Discussed treatment options divided between fertility-sparing (myomectomy) and non (hysterectomy, [place]).  Reviewed medical treatment options to manage the symptoms of fibroids including: cOCPs, progestin-only pills/implant/shot/IUD, tranexamic acid, temporary use of GnRH analogues.  Discussed benefits of  laparoscopic myomectomy and expected surgery length, outpatient status but may need overnight observation depending upon length of surgery and blood loss.  Discussed risk of complications with surgery including: infection, damage to surrounding organs, risk for VTE, bleeding requiring treatment for anemia including transfusion, discovery of occult malignancy with potential for spread with a uterine cut-through procedure. Small risk that a hysterectomy would need to be performed at time of myomectomy if bleeding cannot be controlled.   Discussed plan for contained morcellation to minimize spillage but this method is not perfect at preventing spread.  Discussed estimated risk occult malignancy based on history/tests/age is approx <03/998.  Discussed expectations on DOS including outpatient status, need for 2-3 weeks off work/exercise for recovery, 1 month off strenuous exercise.  Discussed risk of fibroid recurrence requiring additional surgery may be as high as 30-40% given her age and fibroid characteristics.  Discussed impact on fertility/pregnancy and that I will recommend she consider a cesarean delivery with future pregnancies.  AAGL handouts provided. Preop and postop instructions reviewed .     All questions answered and pt would like to proceed with surgery [facility] myomectomy +/-hysteroscopic myomectomy   Will wait for blood work prior to booking.   Will need PEC visit, CBC, T/S.  Needs to have annual exam/pap with Dr. Madie Reno.   All questions answered    Orders Placed This Encounter   Procedures    Iron Profile    Ferritin    CBC without Differential         I, Ander Gaster, attest that the time I spent in direct and indirect patient care and documentation today was 60 mins.      Venetia Night, MD, FACOG  [facility]  [facility]    [address]  [address]  [place], [place] [address]  P: [phone]  F: [phone]
